# Patient Record
Sex: Female | Born: 1955
Health system: Southern US, Community
[De-identification: ages and names within clinical notes are randomized; demographics above are authoritative.]

## PROBLEM LIST (undated history)

## (undated) DIAGNOSIS — R5383 Other fatigue: Secondary | ICD-10-CM

## (undated) DIAGNOSIS — Z1379 Encounter for other screening for genetic and chromosomal anomalies: Secondary | ICD-10-CM

## (undated) DIAGNOSIS — G5603 Carpal tunnel syndrome, bilateral upper limbs: Secondary | ICD-10-CM

## (undated) DIAGNOSIS — Z803 Family history of malignant neoplasm of breast: Secondary | ICD-10-CM

## (undated) DIAGNOSIS — R232 Flushing: Secondary | ICD-10-CM

## (undated) DIAGNOSIS — R011 Cardiac murmur, unspecified: Secondary | ICD-10-CM

## (undated) DIAGNOSIS — I10 Essential (primary) hypertension: Secondary | ICD-10-CM

## (undated) DIAGNOSIS — Z923 Personal history of irradiation: Secondary | ICD-10-CM

## (undated) HISTORY — PX: BREAST EXCISIONAL BIOPSY: SUR124

## (undated) HISTORY — PX: PARTIAL HYSTERECTOMY: SHX80

## (undated) HISTORY — DX: Carpal tunnel syndrome, bilateral upper limbs: G56.03

## (undated) HISTORY — DX: Family history of malignant neoplasm of breast: Z80.3

## (undated) HISTORY — DX: Flushing: R23.2

## (undated) HISTORY — DX: Other fatigue: R53.83

## (undated) HISTORY — PX: AUGMENTATION MAMMAPLASTY: SUR837

## (undated) HISTORY — PX: KNEE SURGERY: SHX244

## (undated) HISTORY — DX: Essential (primary) hypertension: I10

## (undated) HISTORY — PX: FOOT SURGERY: SHX648

## (undated) HISTORY — DX: Encounter for other screening for genetic and chromosomal anomalies: Z13.79

---

## 2007-01-28 ENCOUNTER — Inpatient Hospital Stay (HOSPITAL_COMMUNITY): Admission: EM | Admit: 2007-01-28 | Discharge: 2007-01-30 | Payer: Self-pay | Admitting: Emergency Medicine

## 2007-03-11 ENCOUNTER — Encounter: Admission: RE | Admit: 2007-03-11 | Discharge: 2007-06-09 | Payer: Self-pay | Admitting: Specialist

## 2008-09-26 ENCOUNTER — Observation Stay: Payer: Self-pay | Admitting: Internal Medicine

## 2010-11-19 ENCOUNTER — Other Ambulatory Visit (HOSPITAL_COMMUNITY): Payer: Self-pay | Admitting: Obstetrics and Gynecology

## 2010-11-19 DIAGNOSIS — N63 Unspecified lump in unspecified breast: Secondary | ICD-10-CM

## 2010-12-10 ENCOUNTER — Ambulatory Visit
Admission: RE | Admit: 2010-12-10 | Discharge: 2010-12-10 | Disposition: A | Discharge status: Home / Self Care | Payer: BC Managed Care – PPO | Source: Ambulatory Visit | Attending: Obstetrics and Gynecology | Admitting: Obstetrics and Gynecology

## 2010-12-10 DIAGNOSIS — N63 Unspecified lump in unspecified breast: Secondary | ICD-10-CM

## 2010-12-24 NOTE — Op Note (Signed)
NAMESIRENIA, WHITIS                ACCOUNT NO.:  [id number]   MEDICAL RECORD NO.:  [id number]          PATIENT TYPE:  INP   LOCATION:  1444                         FACILITY:  [facility]   PHYSICIAN:  Kerrin Champagne, M.D.   DATE OF BIRTH:  [birth year]   DATE OF PROCEDURE:  01/28/2007  DATE OF DISCHARGE:                               OPERATIVE REPORT   PREOPERATIVE DIAGNOSIS:  Comminuted closed left inferior pole patella  fracture.   POSTOPERATIVE DIAGNOSIS:  Comminuted closed left inferior pole patella  fracture.   PROCEDURE:  Excision of left inferior pole patellar fracture fragments  and repair of patellar tendon to superior pole fragment, repair of  retinaculum of the knee.   SURGEON:  Kerrin Champagne, M.D.   ASSISTANT:  None.   ANESTHESIA:  General via orotracheal intubation, Dr. Lucille Passy   TOTAL TOURNIQUET TIME:  280 mmHg for 63 minutes.   ESTIMATED BLOOD LOSS:  150 mL, all knee effusion primarily.   COMPLICATIONS:  None.   BRIEF CLINICAL HISTORY:  A 55 year old female who reportedly fell at the  edge of her yard last evening about 7:30 landing with her left knee  against a concrete water drain.  This happened at the edge of her yard  and roadside.  She is brought to the emergency room and underwent  evaluation with radiographs demonstrating a severely comminuted inferior  pole patella fracture of the left leg.  Fracture in four fragments.  The  superior pole larger fragment was in good condition.   INTRAOPERATIVE FINDINGS:  As described.  The patient was found to have  primarily a comminuted inferior pole patellar fracture with multiple  pieces loose without soft tissue attachment, therefore, requiring  resection of the inferior pole, comminuted fragments, and reattachment  of the patellar tendon to the superior pole fragments using FiberWire  through drill holes.   DESCRIPTION OF PROCEDURE:  After adequate general anesthesia, the left  lower extremity prep  with DuraPrep solution, draped in the usual manner.  Elevation of the left lower extremity, tourniquet inflated to 280 mmHg  with Esmarch used to exsanguinate the leg.  Leg flexed while inflating  the tourniquet.  Note that the triangles for the knee were used during  the case.  The incision of midline approximately 10-11 cm in length  through skin and subcu layers down to the peritenon layer.  This was  incised in line with the skin incision and spread both medial lateral.   Fracture site identified, opened, irrigated with copious amounts of  irrigant solution.  Examination of inferior pole fragments demonstrated  that 2-3 fragments were loose free and no significant attachments.  They  were comminuted and crushed and unable to be fixed adequately.  They  were, therefore, resected leaving some bony material remaining at the  superior margin of the patella tendon.  The knee joint was inspected,  irrigated with copious amounts of irrigant solution in order to  determine if there were any fragments of bone within the joint, none was  present.  Following irrigation, then two #2 FiberWires were placed just  posterior and inferior to the bony fragments with pull of the patella  ligament that was remaining as residual.  These were then passed  superiorly. Drill holes were then placed, a central drill hole within  the central portion of the patella extending from the superior anterior  margin of the patella to near the bone surface of the patient's patella  at the osteochondral junction.  Note, that a 3 mm cutting bur was then  used to cut a trough in the broken distal end of the patella to allow  for adequate bone surface for healing of the tendon to bone.   After placing the drill holes, then the suture passer was used to pass  two central sutures through the central hole and then medial and lateral  as appropriate.  These were then pulled distal and range of motion of  the knee demonstrated  the patella tracking quite nicely within the  distal femoral groove with no signs of subluxation, either medial or  lateral.  Towel clips were then attached to the retinaculum near the  area of the fracture site in order to hold this closed.  Then, the  FiberWire sutures tightened over the superior pole of the patella  holding it in place with a hemostat while suturing tight.  This  completed, then range of motion of the knee was performed.  This  demonstrated excellent range of motion of the patella and excellent  tracking within the central portions of the distal femoral trochlea.  This being the case, irrigation was performed both medial and lateral.  The patient's synovium was then approximated with interrupted 0 Vicryl  sutures.  The retinaculum of the knee approximated with interrupted 0  Vicryl sutures, both medial and lateral.   The patient then had 0 Vicryl sutures used to attach soft tissue  remnants of the patient's patellar tendon over the superior aspect of  the fracture site closing this area down, removing bone where necessary  in order to ensure a smooth appearance to the patella inferiorly.  The  paratenon was then approximated with interrupted 0 Vicryl sutures, deep  subcu layers with interrupted 0 Vicryl sutures, the more superficial  layers with interrupted 2-0 Vicryl sutures, and the skin closed with a  running subcu stitch of 4-0 Vicryl.  Steri-Strips were then applied,  4x4s, ABD pad, fixed to the skin with Kerlix and Ace then Webril applied  from the left ankle to the upper thigh.  Ace wrap applied.  The patient  then had an ice pack and then knee immobilizer.  The tourniquet was  released prior to application of dressings.  The patient was then  reactivated, extubated, and returned to the recovery room in  satisfactory condition.  All instrument and sponge counts were correct.      Kerrin Champagne, M.D.  Electronically Signed    JEN/MEDQ  D:  01/28/2007   T:  01/28/2007  Job:  [id number]

## 2011-05-28 LAB — HEMOGLOBIN AND HEMATOCRIT, BLOOD
HCT: 29.6 — ABNORMAL LOW
Hemoglobin: 10.6 — ABNORMAL LOW

## 2011-05-28 LAB — DIFFERENTIAL
Basophils Relative: 1
Eosinophils Absolute: 0.1
Lymphs Abs: 2.6
Monocytes Relative: 7
Neutro Abs: 8 — ABNORMAL HIGH
Neutrophils Relative %: 69

## 2011-05-28 LAB — CBC
HCT: 31.3 — ABNORMAL LOW
Hemoglobin: 10.6 — ABNORMAL LOW
MCHC: 34
RBC: 3.95
RDW: 13.2

## 2011-05-28 LAB — COMPREHENSIVE METABOLIC PANEL
Alkaline Phosphatase: 88
BUN: 16
CO2: 26
Calcium: 8.8
GFR calc non Af Amer: >60
Glucose, Bld: 152 — ABNORMAL HIGH
Total Protein: 6.9

## 2013-04-04 ENCOUNTER — Other Ambulatory Visit: Payer: Self-pay

## 2013-04-04 ENCOUNTER — Ambulatory Visit
Admission: RE | Admit: 2013-04-04 | Discharge: 2013-04-04 | Disposition: A | Discharge status: Home / Self Care | Payer: Self-pay | Source: Ambulatory Visit

## 2013-04-04 DIAGNOSIS — Z1231 Encounter for screening mammogram for malignant neoplasm of breast: Secondary | ICD-10-CM

## 2013-12-27 ENCOUNTER — Ambulatory Visit: Payer: BC Managed Care – PPO | Attending: Specialist

## 2013-12-27 DIAGNOSIS — R262 Difficulty in walking, not elsewhere classified: Secondary | ICD-10-CM | POA: Insufficient documentation

## 2013-12-27 DIAGNOSIS — IMO0001 Reserved for inherently not codable concepts without codable children: Secondary | ICD-10-CM | POA: Insufficient documentation

## 2013-12-27 DIAGNOSIS — M25569 Pain in unspecified knee: Secondary | ICD-10-CM | POA: Insufficient documentation

## 2014-01-09 ENCOUNTER — Ambulatory Visit: Payer: BC Managed Care – PPO | Attending: Specialist | Admitting: Physical Therapy

## 2014-01-09 DIAGNOSIS — R262 Difficulty in walking, not elsewhere classified: Secondary | ICD-10-CM | POA: Insufficient documentation

## 2014-01-09 DIAGNOSIS — M25569 Pain in unspecified knee: Secondary | ICD-10-CM | POA: Insufficient documentation

## 2014-01-09 DIAGNOSIS — Z5189 Encounter for other specified aftercare: Secondary | ICD-10-CM | POA: Insufficient documentation

## 2014-01-11 ENCOUNTER — Ambulatory Visit: Payer: BC Managed Care – PPO | Admitting: Physical Therapy

## 2014-01-16 ENCOUNTER — Encounter: Payer: BC Managed Care – PPO | Admitting: Physical Therapy

## 2014-01-18 ENCOUNTER — Encounter: Payer: BC Managed Care – PPO | Admitting: Physical Therapy

## 2014-04-05 ENCOUNTER — Other Ambulatory Visit: Payer: Self-pay

## 2014-04-05 DIAGNOSIS — Z1231 Encounter for screening mammogram for malignant neoplasm of breast: Secondary | ICD-10-CM

## 2014-04-12 ENCOUNTER — Ambulatory Visit
Admission: RE | Admit: 2014-04-12 | Discharge: 2014-04-12 | Disposition: A | Discharge status: Home / Self Care | Payer: BC Managed Care – PPO | Source: Ambulatory Visit

## 2014-04-12 DIAGNOSIS — Z1231 Encounter for screening mammogram for malignant neoplasm of breast: Secondary | ICD-10-CM

## 2014-08-08 ENCOUNTER — Encounter: Payer: Self-pay | Admitting: *Deleted

## 2014-09-26 ENCOUNTER — Encounter: Payer: Self-pay | Admitting: *Deleted

## 2015-01-30 ENCOUNTER — Other Ambulatory Visit: Payer: Self-pay | Admitting: Specialist

## 2015-01-30 DIAGNOSIS — M25562 Pain in left knee: Secondary | ICD-10-CM

## 2015-02-03 ENCOUNTER — Ambulatory Visit
Admission: RE | Admit: 2015-02-03 | Discharge: 2015-02-03 | Disposition: A | Discharge status: Home / Self Care | Payer: BLUE CROSS/BLUE SHIELD | Source: Ambulatory Visit | Attending: Specialist | Admitting: Specialist

## 2015-02-03 DIAGNOSIS — M25562 Pain in left knee: Secondary | ICD-10-CM

## 2015-02-05 ENCOUNTER — Other Ambulatory Visit: Payer: Self-pay | Admitting: Specialist

## 2015-02-05 DIAGNOSIS — M25562 Pain in left knee: Secondary | ICD-10-CM

## 2015-03-16 ENCOUNTER — Other Ambulatory Visit: Payer: Self-pay

## 2015-03-16 DIAGNOSIS — Z1231 Encounter for screening mammogram for malignant neoplasm of breast: Secondary | ICD-10-CM

## 2015-04-18 ENCOUNTER — Ambulatory Visit: Payer: BLUE CROSS/BLUE SHIELD

## 2016-01-17 DIAGNOSIS — M25562 Pain in left knee: Secondary | ICD-10-CM | POA: Diagnosis not present

## 2016-02-08 DIAGNOSIS — J018 Other acute sinusitis: Secondary | ICD-10-CM | POA: Diagnosis not present

## 2016-03-12 DIAGNOSIS — L700 Acne vulgaris: Secondary | ICD-10-CM | POA: Diagnosis not present

## 2016-03-12 DIAGNOSIS — L819 Disorder of pigmentation, unspecified: Secondary | ICD-10-CM | POA: Diagnosis not present

## 2016-03-27 DIAGNOSIS — Q686 Discoid meniscus: Secondary | ICD-10-CM | POA: Diagnosis not present

## 2016-04-03 DIAGNOSIS — M25562 Pain in left knee: Secondary | ICD-10-CM | POA: Diagnosis not present

## 2016-04-09 DIAGNOSIS — I10 Essential (primary) hypertension: Secondary | ICD-10-CM | POA: Diagnosis not present

## 2016-04-23 DIAGNOSIS — Z01419 Encounter for gynecological examination (general) (routine) without abnormal findings: Secondary | ICD-10-CM | POA: Diagnosis not present

## 2016-04-23 DIAGNOSIS — N951 Menopausal and female climacteric states: Secondary | ICD-10-CM | POA: Diagnosis not present

## 2016-04-23 DIAGNOSIS — Z8489 Family history of other specified conditions: Secondary | ICD-10-CM | POA: Diagnosis not present

## 2016-04-23 DIAGNOSIS — Z1231 Encounter for screening mammogram for malignant neoplasm of breast: Secondary | ICD-10-CM | POA: Diagnosis not present

## 2016-05-05 DIAGNOSIS — G8918 Other acute postprocedural pain: Secondary | ICD-10-CM | POA: Diagnosis not present

## 2016-05-05 DIAGNOSIS — M7652 Patellar tendinitis, left knee: Secondary | ICD-10-CM | POA: Diagnosis not present

## 2016-05-05 DIAGNOSIS — M94262 Chondromalacia, left knee: Secondary | ICD-10-CM | POA: Diagnosis not present

## 2016-05-12 ENCOUNTER — Inpatient Hospital Stay (INDEPENDENT_AMBULATORY_CARE_PROVIDER_SITE_OTHER): Payer: BLUE CROSS/BLUE SHIELD | Admitting: Orthopedic Surgery

## 2016-05-12 DIAGNOSIS — M94262 Chondromalacia, left knee: Secondary | ICD-10-CM

## 2016-05-12 DIAGNOSIS — M7652 Patellar tendinitis, left knee: Secondary | ICD-10-CM

## 2016-06-02 ENCOUNTER — Ambulatory Visit (INDEPENDENT_AMBULATORY_CARE_PROVIDER_SITE_OTHER): Payer: BLUE CROSS/BLUE SHIELD | Admitting: Orthopedic Surgery

## 2016-06-02 DIAGNOSIS — M94262 Chondromalacia, left knee: Secondary | ICD-10-CM | POA: Insufficient documentation

## 2016-06-02 NOTE — Progress Notes (Signed)
   Office Visit Note  Patient is postop left knee DOA and debridement done on 05/05/16, 4 weeks out. Doing well, no pain/problems, is able to go up and down steps with ease now. No meds being taken for left knee. She does report a little pain at nighttime, nothing requiring meds.  Patient is now 4 weeks out from arthroscopy she is having a little bit of pain at night she generally okay on the stairs she's able to go to the grocery store she is not taking any pain medications she works at [employer] I think she is okay to do sedentary work for 2 weeks and then return to regular duty at [employer] I am going to  see her back as needed. on exam she has excellent range of motion and no effusion in the left knee she has a little bit of quad atrophy but that will be improved with continued nonweightbearing quad strengthening exercises  Patient: Brandy Thornton           Date of Birth: May 06, 1956           MRN: [id number] Visit Date: 06/02/2016              Requested by: Basil Dess, MD PCP: [facility], Carol Ada, MD   Assessment & Plan: Visit Diagnoses:  1. Chondromalacia of left knee     Plan:   Follow-Up Instructions: Return if symptoms worsen or fail to improve.   Orders:  No orders of the defined types were placed in this encounter.  No orders of the defined types were placed in this encounter.     Procedures: No procedures performed   Clinical Data: No additional findings.   Subjective: Chief Complaint  Patient presents with  . Left Knee - Routine Post Op    HPI  Review of Systems   Objective: Vital Signs: Ht 5\' 6"  (1.676 m)   Wt 175 lb (79.4 kg)   BMI 28.25 kg/m   Physical Exam  Ortho Exam  Specialty Comments:  No specialty comments available.  Imaging: No results found.   PMFS History: Patient Active Problem List   Diagnosis Date Noted  . Chondromalacia of left knee 06/02/2016   Past Medical History:  Diagnosis Date  . Benign  hypertension   . Bilateral carpal tunnel syndrome   . Fatigue   . Hot flashes     No family history on file.  No past surgical history on file. Social History   Occupational History  . Not on file.   Social History Main Topics  . Smoking status: Not on file  . Smokeless tobacco: Not on file  . Alcohol use Not on file  . Drug use: Unknown  . Sexual activity: Not on file

## 2016-10-13 DIAGNOSIS — I10 Essential (primary) hypertension: Secondary | ICD-10-CM | POA: Diagnosis not present

## 2016-11-13 DIAGNOSIS — R944 Abnormal results of kidney function studies: Secondary | ICD-10-CM | POA: Diagnosis not present

## 2017-05-06 DIAGNOSIS — N951 Menopausal and female climacteric states: Secondary | ICD-10-CM | POA: Diagnosis not present

## 2017-05-06 DIAGNOSIS — Z01419 Encounter for gynecological examination (general) (routine) without abnormal findings: Secondary | ICD-10-CM | POA: Diagnosis not present

## 2017-07-08 DIAGNOSIS — L700 Acne vulgaris: Secondary | ICD-10-CM | POA: Diagnosis not present

## 2017-07-08 DIAGNOSIS — L819 Disorder of pigmentation, unspecified: Secondary | ICD-10-CM | POA: Diagnosis not present

## 2017-10-07 DIAGNOSIS — N898 Other specified noninflammatory disorders of vagina: Secondary | ICD-10-CM | POA: Diagnosis not present

## 2017-10-07 DIAGNOSIS — N941 Unspecified dyspareunia: Secondary | ICD-10-CM | POA: Diagnosis not present

## 2017-10-07 DIAGNOSIS — N644 Mastodynia: Secondary | ICD-10-CM | POA: Diagnosis not present

## 2017-10-07 DIAGNOSIS — N6310 Unspecified lump in the right breast, unspecified quadrant: Secondary | ICD-10-CM | POA: Diagnosis not present

## 2017-10-08 ENCOUNTER — Other Ambulatory Visit: Payer: Self-pay | Admitting: Obstetrics and Gynecology

## 2017-10-08 DIAGNOSIS — N6310 Unspecified lump in the right breast, unspecified quadrant: Secondary | ICD-10-CM

## 2017-10-14 ENCOUNTER — Other Ambulatory Visit: Payer: Self-pay | Admitting: Obstetrics and Gynecology

## 2017-10-14 ENCOUNTER — Ambulatory Visit
Admission: RE | Admit: 2017-10-14 | Discharge: 2017-10-14 | Disposition: A | Discharge status: Home / Self Care | Payer: BLUE CROSS/BLUE SHIELD | Source: Ambulatory Visit | Attending: Obstetrics and Gynecology | Admitting: Obstetrics and Gynecology

## 2017-10-14 DIAGNOSIS — N6310 Unspecified lump in the right breast, unspecified quadrant: Secondary | ICD-10-CM

## 2017-10-14 DIAGNOSIS — R922 Inconclusive mammogram: Secondary | ICD-10-CM | POA: Diagnosis not present

## 2017-10-14 DIAGNOSIS — R599 Enlarged lymph nodes, unspecified: Secondary | ICD-10-CM

## 2017-10-20 ENCOUNTER — Ambulatory Visit
Admission: RE | Admit: 2017-10-20 | Discharge: 2017-10-20 | Disposition: A | Discharge status: Home / Self Care | Payer: BLUE CROSS/BLUE SHIELD | Source: Ambulatory Visit | Attending: Obstetrics and Gynecology | Admitting: Obstetrics and Gynecology

## 2017-10-20 ENCOUNTER — Other Ambulatory Visit: Payer: Self-pay | Admitting: Obstetrics and Gynecology

## 2017-10-20 DIAGNOSIS — N6310 Unspecified lump in the right breast, unspecified quadrant: Secondary | ICD-10-CM

## 2017-10-20 DIAGNOSIS — R599 Enlarged lymph nodes, unspecified: Secondary | ICD-10-CM

## 2017-10-20 DIAGNOSIS — C773 Secondary and unspecified malignant neoplasm of axilla and upper limb lymph nodes: Secondary | ICD-10-CM | POA: Diagnosis not present

## 2017-10-20 DIAGNOSIS — R590 Localized enlarged lymph nodes: Secondary | ICD-10-CM | POA: Diagnosis not present

## 2017-10-20 DIAGNOSIS — C50311 Malignant neoplasm of lower-inner quadrant of right female breast: Secondary | ICD-10-CM | POA: Diagnosis not present

## 2017-10-20 DIAGNOSIS — N6314 Unspecified lump in the right breast, lower inner quadrant: Secondary | ICD-10-CM | POA: Diagnosis not present

## 2017-10-21 ENCOUNTER — Telehealth: Payer: Self-pay | Admitting: Hematology and Oncology

## 2017-10-21 NOTE — Telephone Encounter (Signed)
Spoke with patient to confirm morning [facility] appointment for 3/20, packet will be emailed to patient

## 2017-10-22 ENCOUNTER — Encounter: Payer: Self-pay | Admitting: *Deleted

## 2017-10-22 DIAGNOSIS — C50311 Malignant neoplasm of lower-inner quadrant of right female breast: Secondary | ICD-10-CM | POA: Insufficient documentation

## 2017-10-22 DIAGNOSIS — Z170 Estrogen receptor positive status [ER+]: Secondary | ICD-10-CM

## 2017-10-25 DIAGNOSIS — C50919 Malignant neoplasm of unspecified site of unspecified female breast: Secondary | ICD-10-CM | POA: Insufficient documentation

## 2017-10-27 ENCOUNTER — Other Ambulatory Visit: Payer: Self-pay | Admitting: *Deleted

## 2017-10-28 ENCOUNTER — Inpatient Hospital Stay: Payer: BLUE CROSS/BLUE SHIELD

## 2017-10-28 ENCOUNTER — Telehealth: Payer: Self-pay | Admitting: *Deleted

## 2017-10-28 ENCOUNTER — Encounter: Payer: Self-pay | Admitting: *Deleted

## 2017-10-28 ENCOUNTER — Other Ambulatory Visit: Payer: Self-pay

## 2017-10-28 ENCOUNTER — Ambulatory Visit
Admission: RE | Admit: 2017-10-28 | Discharge: 2017-10-28 | Disposition: A | Discharge status: Home / Self Care | Payer: BLUE CROSS/BLUE SHIELD | Source: Ambulatory Visit | Attending: Radiation Oncology | Admitting: Radiation Oncology

## 2017-10-28 ENCOUNTER — Ambulatory Visit: Payer: BLUE CROSS/BLUE SHIELD | Attending: Surgery | Admitting: Physical Therapy

## 2017-10-28 ENCOUNTER — Encounter: Payer: Self-pay | Admitting: Hematology and Oncology

## 2017-10-28 ENCOUNTER — Other Ambulatory Visit: Payer: Self-pay | Admitting: *Deleted

## 2017-10-28 ENCOUNTER — Encounter: Payer: Self-pay | Admitting: Physical Therapy

## 2017-10-28 ENCOUNTER — Inpatient Hospital Stay: Payer: BLUE CROSS/BLUE SHIELD | Attending: Hematology and Oncology | Admitting: Hematology and Oncology

## 2017-10-28 DIAGNOSIS — Z803 Family history of malignant neoplasm of breast: Secondary | ICD-10-CM

## 2017-10-28 DIAGNOSIS — C50311 Malignant neoplasm of lower-inner quadrant of right female breast: Secondary | ICD-10-CM

## 2017-10-28 DIAGNOSIS — Z170 Estrogen receptor positive status [ER+]: Secondary | ICD-10-CM

## 2017-10-28 DIAGNOSIS — C773 Secondary and unspecified malignant neoplasm of axilla and upper limb lymph nodes: Secondary | ICD-10-CM

## 2017-10-28 DIAGNOSIS — R293 Abnormal posture: Secondary | ICD-10-CM | POA: Insufficient documentation

## 2017-10-28 DIAGNOSIS — C50911 Malignant neoplasm of unspecified site of right female breast: Secondary | ICD-10-CM | POA: Diagnosis not present

## 2017-10-28 LAB — CBC WITH DIFFERENTIAL (CANCER CENTER ONLY)
BASOS ABS: 0.1 10*3/uL (ref 0.0–0.1)
Basophils Relative: 1 %
Eosinophils Absolute: 0.1 10*3/uL (ref 0.0–0.5)
Eosinophils Relative: 1 %
HEMATOCRIT: 36.4 % (ref 34.8–46.6)
Hemoglobin: 12.2 g/dL (ref 11.6–15.9)
LYMPHS PCT: 34 %
Lymphs Abs: 2.2 10*3/uL (ref 0.9–3.3)
MCH: 26.8 pg (ref 25.1–34.0)
MCHC: 33.6 g/dL (ref 31.5–36.0)
MCV: 79.9 fL (ref 79.5–101.0)
MONO ABS: 0.6 10*3/uL (ref 0.1–0.9)
Monocytes Relative: 9 %
NEUTROS ABS: 3.5 10*3/uL (ref 1.5–6.5)
Neutrophils Relative %: 55 %
Platelet Count: 448 10*3/uL — ABNORMAL HIGH (ref 145–400)
RBC: 4.56 MIL/uL (ref 3.70–5.45)
RDW: 14.2 % (ref 11.2–14.5)
WBC: 6.5 10*3/uL (ref 3.9–10.3)

## 2017-10-28 LAB — CMP (CANCER CENTER ONLY)
ALBUMIN: 4.2 g/dL (ref 3.5–5.0)
ALT: 14 U/L (ref 0–55)
ANION GAP: 10 (ref 3–11)
AST: 16 U/L (ref 5–34)
Alkaline Phosphatase: 60 U/L (ref 40–150)
BILIRUBIN TOTAL: 0.4 mg/dL (ref 0.2–1.2)
BUN: 11 mg/dL (ref 7–26)
CO2: 27 mmol/L (ref 22–29)
Calcium: 10.4 mg/dL (ref 8.4–10.4)
Chloride: 104 mmol/L (ref 98–109)
Creatinine: 0.74 mg/dL (ref 0.60–1.10)
GFR, Est AFR Am: >60 mL/min (ref >60)
GFR, Estimated: >60 mL/min (ref >60)
GLUCOSE: 102 mg/dL (ref 70–140)
POTASSIUM: 4.2 mmol/L (ref 3.5–5.1)
Sodium: 141 mmol/L (ref 136–145)
TOTAL PROTEIN: 8.1 g/dL (ref 6.4–8.3)

## 2017-10-28 MED ORDER — LETROZOLE 2.5 MG PO TABS: 2.5000 mg | ORAL_TABLET | Freq: Every day | ORAL | 3 refills | Status: DC

## 2017-10-28 NOTE — Telephone Encounter (Signed)
Ordered mammaprint (core) per Dr. Lindi Adie.  Faxed requisition to [place] and [place].

## 2017-10-28 NOTE — Progress Notes (Addendum)
Radiation Oncology         [phone] ________________________________  Name: Brandy Thornton        MRN: [id number]  Date of Service: 10/28/2017 DOB: [birth year]  CC:[facility], [facility]  [name] Overall, MD     REFERRING PHYSICIAN: [name] Overall, MD   DIAGNOSIS: The encounter diagnosis was Malignant neoplasm of lower-inner quadrant of right breast of female, estrogen receptor positive ([place]).  Stage IIA, cT2 N1 M0, grade 2, ER/PR positive Invasive lobular carcinoma, mammary carcinoma in situ of the right breast.  HISTORY OF PRESENT ILLNESS: Brandy Thornton is a 62 y.o. female seen in the multidisciplinary breast clinic for a new diagnosis of right breast cancer. The patient self-palpated a lump within the right breast. Subsequent mammogram on 10/14/17 showed a highly suspicious mass in the right breast at the 4:30 position, 4 cm from the nipple. This mass measured 2.8 cm with associated architectural distortion. There was an additional irregular mass within the right breast at the 3:30 position, 1 cm from the nipple, measuring 1.1 cm. This was suspicious for multifocal disease. At least 3 mildly prominent lymph nodes noted in the right axilla and lower axilla. Biopsy of the masses on 10/20/17 showed grade II invasive mammary carcinoma, mammary carcinoma in situ of the right breast both at the 4:30 and 3:30 positions. Metastatic mammary carcinoma noted in the right axillary lymph node. She comes today for treatment recommendations regarding her cancer.  PREVIOUS RADIATION THERAPY: No   PAST MEDICAL HISTORY:  Past Medical History:  Diagnosis Date  . Benign hypertension   . Bilateral carpal tunnel syndrome   . Fatigue   . Hot flashes        PAST SURGICAL HISTORY: Past Surgical History:  Procedure Laterality Date  . BREAST EXCISIONAL BIOPSY Bilateral      FAMILY HISTORY:  Family History  Problem Relation Age of Onset  . Breast cancer Sister 26     SOCIAL  HISTORY:   She is single and lives in [place]. She works for [employer] in an [employer] in [place]. She is accompanied by her sister, son, and parents.   ALLERGIES: Lisinopril   MEDICATIONS:  Current Outpatient Medications  Medication Sig Dispense Refill  . amLODipine (NORVASC) 5 MG tablet Take 5 mg by mouth daily.    . valsartan-hydrochlorothiazide (DIOVAN-HCT) 320-12.5 MG per tablet Take 1 tablet by mouth daily.     No current facility-administered medications for this encounter.      REVIEW OF SYSTEMS: On review of systems, the patient reports that she is doing well overall. Patient is positive for contacts, breast lump and pain, and hot flashes. She denies any chest pain, shortness of breath, cough, fevers, chills, night sweats, unintended weight changes. She denies any bowel or bladder disturbances, and denies abdominal pain, nausea or vomiting. She denies any new musculoskeletal or joint aches or pains. A complete review of systems is obtained and is otherwise negative.     PHYSICAL EXAM:  Wt Readings from Last 3 Encounters:  06/02/16 175 lb (79.4 kg)   Temp Readings from Last 3 Encounters:  No data found for Temp   BP Readings from Last 3 Encounters:  No data found for BP   Pulse Readings from Last 3 Encounters:  No data found for Pulse   Vitals with BMI 10/28/2017  Height 5' 6"   Weight 170 lbs 11 oz  BMI 17.49  Systolic 449  Diastolic 99  Pulse 88  Respirations 18  In general this is a well appearing African American female in no acute distress. She is alert and oriented x4 and appropriate throughout the examination. HEENT reveals that the patient is normocephalic, atraumatic. EOMs are intact. PERRLA. Skin is intact without any evidence of gross lesions. Cardiovascular exam reveals a regular rate and rhythm, no clicks rubs or murmurs are auscultated. Chest is clear to auscultation bilaterally. Lymphatic assessment is performed and does not reveal any adenopathy  in the cervical, supraclavicular, axillary, or inguinal chains. Bilateral breast exam is performed. Left breast had no palpable mass or nipple discharge. Right breast the patient has palpable induration in the lower inner quadrant with some mild bruising noted. This area of induration measured 2 x 3 cm. There was no nipple discharge or bleeding. Patient has a small biopsy site from her axillary node biopsy. Abdomen has active bowel sounds in all quadrants and is intact. The abdomen is soft, non tender, non distended. Lower extremities are negative for pretibial pitting edema, deep calf tenderness, cyanosis or clubbing.   ECOG = 0  0 - Asymptomatic (Fully active, able to carry on all predisease activities without restriction)  1 - Symptomatic but completely ambulatory (Restricted in physically strenuous activity but ambulatory and able to carry out work of a light or sedentary nature. For example, light housework, office work)  2 - Symptomatic, <50% in bed during the day (Ambulatory and capable of all self care but unable to carry out any work activities. Up and about more than 50% of waking hours)  3 - Symptomatic, >50% in bed, but not bedbound (Capable of only limited self-care, confined to bed or chair 50% or more of waking hours)  4 - Bedbound (Completely disabled. Cannot carry on any self-care. Totally confined to bed or chair)  5 - Death   Eustace Pen MM, Creech RH, Tormey DC, et al. [phone]). "Toxicity and response criteria of the [facility]". [place] Oncol. 5 (6): 649-55    LABORATORY DATA:  Lab Results  Component Value Date   WBC 11.6 (H) 01/27/2007   HGB 10.6 (L) 01/29/2007   HCT 29.6 (L) 01/29/2007   MCV 79.2 01/27/2007   PLT 435 (H) 01/27/2007   Lab Results  Component Value Date   NA 139 01/27/2007   K 3.5 01/27/2007   CL 108 01/27/2007   CO2 26 01/27/2007   Lab Results  Component Value Date   ALT 24 01/27/2007   AST 21 01/27/2007   ALKPHOS 88  01/27/2007   BILITOT 0.4 01/27/2007      RADIOGRAPHY: US Breast Ltd Uni Right Inc Axilla  Result Date: 10/14/2017 CLINICAL DATA:  Patient describes a palpable lump within the right breast. Family history of breast cancer (sister at age 26). EXAM: DIGITAL DIAGNOSTIC BILATERAL MAMMOGRAM WITH CAD AND TOMO ULTRASOUND RIGHT BREAST COMPARISON:  Previous exam(s). ACR Breast Density Category c: The breast tissue is heterogeneously dense, which may obscure small masses. FINDINGS: There is architectural distortion within the lower inner quadrant of the right breast, middle to posterior depth, with probable associated mass, corresponding to the area of palpable concern. There are no additional dominant masses, suspicious calcifications or secondary signs of malignancy identified within the right breast. There are no dominant masses, suspicious calcifications or secondary signs of malignancy identified within the left breast. Mammographic images were processed with CAD. Targeted ultrasound is performed, showing an irregular hypoechoic mass in the right breast at the 4:30 o'clock axis, 4 cm from the nipple, measuring  2.8 x 1.9 x 2 cm, with internal vascularity, corresponding to the palpable lump and associated mammographic finding. There is an additional irregular hypoechoic mass in the right breast at the 3:30 o'clock axis, 1 cm from the nipple, measuring 1.1 x 0.9 x 1 cm, anti parallel orientation, avascular, corresponding as an incidental finding. Right axilla was evaluated with ultrasound showing a mildly prominent lymph node in the lower right axilla with cortical thickness measurement of 4 mm, suspicious for metastasis. Two additional mildly prominent lymph nodes, with asymmetric cortical thickening, are identified in the right axilla. IMPRESSION: 1. Highly suspicious mass in the right breast at the 4:30 o'clock axis, 4 cm from the nipple, measuring 2.8 cm, with associated architectural distortion, corresponding to  patient's palpable lump. Ultrasound-guided biopsy is recommended. 2. Additional irregular mass within the right breast at the 3:30 o'clock axis, 1 cm from the nipple, measuring 1.1 cm, suspicious for multicentric disease. Ultrasound-guided biopsy is also recommended for this mass. 3. At least 3 mildly prominent lymph nodes in the right axilla and lower axilla. Ultrasound-guided biopsy is recommended for 1 of these lymph nodes. 4. No evidence of malignancy within the left breast. RECOMMENDATION: 1. Ultrasound-guided biopsy of the dominant right breast mass at the 4:30 o'clock axis. 2. Ultrasound-guided biopsy of the additional smaller mass within the right breast at the 3:30 o'clock axis. 3. Ultrasound-guided biopsy of 1 of the mildly prominent lymph nodes in the right axilla or lower axilla. Ultrasound-guided biopsies are scheduled for March 12th. I have discussed the findings and recommendations with the patient. Results were also provided in writing at the conclusion of the visit. If applicable, a reminder letter will be sent to the patient regarding the next appointment. BI-RADS CATEGORY  5: Highly suggestive of malignancy. Electronically Signed   By: Franki Cabot M.D.   On: 10/14/2017 13:33   Mm Diag Breast Tomo Bilateral  Result Date: 10/14/2017 CLINICAL DATA:  Patient describes a palpable lump within the right breast. Family history of breast cancer (sister at age 70). EXAM: DIGITAL DIAGNOSTIC BILATERAL MAMMOGRAM WITH CAD AND TOMO ULTRASOUND RIGHT BREAST COMPARISON:  Previous exam(s). ACR Breast Density Category c: The breast tissue is heterogeneously dense, which may obscure small masses. FINDINGS: There is architectural distortion within the lower inner quadrant of the right breast, middle to posterior depth, with probable associated mass, corresponding to the area of palpable concern. There are no additional dominant masses, suspicious calcifications or secondary signs of malignancy identified within  the right breast. There are no dominant masses, suspicious calcifications or secondary signs of malignancy identified within the left breast. Mammographic images were processed with CAD. Targeted ultrasound is performed, showing an irregular hypoechoic mass in the right breast at the 4:30 o'clock axis, 4 cm from the nipple, measuring 2.8 x 1.9 x 2 cm, with internal vascularity, corresponding to the palpable lump and associated mammographic finding. There is an additional irregular hypoechoic mass in the right breast at the 3:30 o'clock axis, 1 cm from the nipple, measuring 1.1 x 0.9 x 1 cm, anti parallel orientation, avascular, corresponding as an incidental finding. Right axilla was evaluated with ultrasound showing a mildly prominent lymph node in the lower right axilla with cortical thickness measurement of 4 mm, suspicious for metastasis. Two additional mildly prominent lymph nodes, with asymmetric cortical thickening, are identified in the right axilla. IMPRESSION: 1. Highly suspicious mass in the right breast at the 4:30 o'clock axis, 4 cm from the nipple, measuring 2.8 cm, with associated architectural distortion,  corresponding to patient's palpable lump. Ultrasound-guided biopsy is recommended. 2. Additional irregular mass within the right breast at the 3:30 o'clock axis, 1 cm from the nipple, measuring 1.1 cm, suspicious for multicentric disease. Ultrasound-guided biopsy is also recommended for this mass. 3. At least 3 mildly prominent lymph nodes in the right axilla and lower axilla. Ultrasound-guided biopsy is recommended for 1 of these lymph nodes. 4. No evidence of malignancy within the left breast. RECOMMENDATION: 1. Ultrasound-guided biopsy of the dominant right breast mass at the 4:30 o'clock axis. 2. Ultrasound-guided biopsy of the additional smaller mass within the right breast at the 3:30 o'clock axis. 3. Ultrasound-guided biopsy of 1 of the mildly prominent lymph nodes in the right axilla or  lower axilla. Ultrasound-guided biopsies are scheduled for March 12th. I have discussed the findings and recommendations with the patient. Results were also provided in writing at the conclusion of the visit. If applicable, a reminder letter will be sent to the patient regarding the next appointment. BI-RADS CATEGORY  5: Highly suggestive of malignancy. Electronically Signed   By: Franki Cabot M.D.   On: 10/14/2017 13:33   [place] Axillary Node Core Biopsy Right  Addendum Date: 10/21/2017   ADDENDUM REPORT: 10/21/2017 12:50 ADDENDUM: Pathology revealed GRADE II INVASIVE MAMMARY CARCINOMA, MAMMARY CARCINOMA IN SITU of the Right breast, both locations, 4:30 o'clock and 3:30 o'clock. METASTATIC MAMMARY CARCINOMA of the Right axillary lymph node. This was found to be concordant by Dr. Ammie Ferrier. Pathology results were discussed with the patient by telephone. The patient reported doing well after the biopsies with tenderness at the sites. Post biopsy instructions and care were reviewed and questions were answered. The patient was encouraged to call [facility] for any additional concerns. The patient was referred to [facility] on October 28, 2017. Recommendation for bilateral breast MRI for further evaluation of extent of disease, density and family history. Pathology results reported by Terie Purser, RN on 10/21/2017. Electronically Signed   By: Ammie Ferrier M.D.   On: 10/21/2017 12:50   Result Date: 10/21/2017 CLINICAL DATA:  62 year old female presenting for ultrasound-guided biopsy of the right breast. EXAM: ULTRASOUND GUIDED RIGHT BREAST CORE NEEDLE BIOPSY COMPARISON:  Previous exam(s). FINDINGS: I met with the patient and we discussed the procedure of ultrasound-guided biopsy, including benefits and alternatives. We discussed the high likelihood of a successful procedure. We discussed the risks of the  procedure, including infection, bleeding, tissue injury, clip migration, and inadequate sampling. Informed written consent was given. The usual time-out protocol was performed immediately prior to the procedure. Lesion #1 quadrant: Lower-inner quadrant Using sterile technique and 1% Lidocaine as local anesthetic, under direct ultrasound visualization, a 14 gauge spring-loaded device was used to perform biopsy of a mass in the right breast 4:30 using a medial approach. At the conclusion of the procedure a ribbon shaped tissue marker clip was deployed into the biopsy cavity. Lesion #2 quadrant: Lower-inner quadrant Using sterile technique and 1% Lidocaine as local anesthetic, under direct ultrasound visualization, a 14 gauge spring-loaded device was used to perform biopsy of a mass in the right breast at 3:30 using an inferior approach. At the conclusion of the procedure a coil shaped tissue marker clip was deployed into the biopsy cavity. Lesion #3 quadrant: Right axilla Using sterile technique and 1% Lidocaine as local anesthetic, under direct ultrasound visualization, a 14 gauge spring-loaded device was used to perform biopsy of a  right axillary lymph node using an inferior approach. At the conclusion of the procedure a spiral shaped tissue marker clip was deployed into the biopsy cavity. Follow up 2 view mammogram was performed and dictated separately. IMPRESSION: 1.  Ultrasound guided biopsy of a right breast mass at 4:30. 2.  Ultrasound-guided biopsy of a right breast mass at 3:30. 3.  Ultrasound-guided biopsy of a right axillary lymph node. No apparent complications. Electronically Signed: By: Ammie Ferrier M.D. On: 10/20/2017 14:13   Mm Clip Placement Right  Result Date: 10/20/2017 CLINICAL DATA:  Post biopsy mammogram of the right breast status post ultrasound-guided biopsy of 2 right breast masses and a right axillary lymph node. EXAM: DIAGNOSTIC RIGHT MAMMOGRAM POST ULTRASOUND BIOPSY COMPARISON:   Previous exam(s). FINDINGS: Mammographic images were obtained following ultrasound guided biopsy of 2 right breast masses in a right axillary lymph node. The ribbon shaped biopsy marking clip is well positioned at the site of the biopsy at 4:30. The coil shaped biopsy marking clip is appropriately positioned at the 3:30 position. The spiral shaped biopsy marking clip is seen on the MLO projection in the low right axilla. IMPRESSION: Appropriate positioning of the 3 biopsy marking clips in the right breast: Ribbon clip at 4:30, coil clip at 3:30 and spiral clip in the right axilla. Final Assessment: Post Procedure Mammograms for Marker Placement Electronically Signed   By: Ammie Ferrier M.D.   On: 10/20/2017 14:21   [place] Rt Breast Bx W Loc Dev 1st Lesion Img Bx Spec US Guide  Addendum Date: 10/21/2017   ADDENDUM REPORT: 10/21/2017 12:50 ADDENDUM: Pathology revealed GRADE II INVASIVE MAMMARY CARCINOMA, MAMMARY CARCINOMA IN SITU of the Right breast, both locations, 4:30 o'clock and 3:30 o'clock. METASTATIC MAMMARY CARCINOMA of the Right axillary lymph node. This was found to be concordant by Dr. Ammie Ferrier. Pathology results were discussed with the patient by telephone. The patient reported doing well after the biopsies with tenderness at the sites. Post biopsy instructions and care were reviewed and questions were answered. The patient was encouraged to call [facility] for any additional concerns. The patient was referred to [facility] on October 28, 2017. Recommendation for bilateral breast MRI for further evaluation of extent of disease, density and family history. Pathology results reported by Terie Purser, RN on 10/21/2017. Electronically Signed   By: Ammie Ferrier M.D.   On: 10/21/2017 12:50   Result Date: 10/21/2017 CLINICAL DATA:  62 year old female presenting for ultrasound-guided biopsy of  the right breast. EXAM: ULTRASOUND GUIDED RIGHT BREAST CORE NEEDLE BIOPSY COMPARISON:  Previous exam(s). FINDINGS: I met with the patient and we discussed the procedure of ultrasound-guided biopsy, including benefits and alternatives. We discussed the high likelihood of a successful procedure. We discussed the risks of the procedure, including infection, bleeding, tissue injury, clip migration, and inadequate sampling. Informed written consent was given. The usual time-out protocol was performed immediately prior to the procedure. Lesion #1 quadrant: Lower-inner quadrant Using sterile technique and 1% Lidocaine as local anesthetic, under direct ultrasound visualization, a 14 gauge spring-loaded device was used to perform biopsy of a mass in the right breast 4:30 using a medial approach. At the conclusion of the procedure a ribbon shaped tissue marker clip was deployed into the biopsy cavity. Lesion #2 quadrant: Lower-inner quadrant Using sterile technique and 1% Lidocaine as local anesthetic, under direct ultrasound visualization, a 14 gauge spring-loaded device was used to perform  biopsy of a mass in the right breast at 3:30 using an inferior approach. At the conclusion of the procedure a coil shaped tissue marker clip was deployed into the biopsy cavity. Lesion #3 quadrant: Right axilla Using sterile technique and 1% Lidocaine as local anesthetic, under direct ultrasound visualization, a 14 gauge spring-loaded device was used to perform biopsy of a right axillary lymph node using an inferior approach. At the conclusion of the procedure a spiral shaped tissue marker clip was deployed into the biopsy cavity. Follow up 2 view mammogram was performed and dictated separately. IMPRESSION: 1.  Ultrasound guided biopsy of a right breast mass at 4:30. 2.  Ultrasound-guided biopsy of a right breast mass at 3:30. 3.  Ultrasound-guided biopsy of a right axillary lymph node. No apparent complications. Electronically Signed:  By: Ammie Ferrier M.D. On: 10/20/2017 14:13   [place] Rt Breast Bx W Loc Dev Ea Add Lesion Img Bx Spec US Guide  Addendum Date: 10/21/2017   ADDENDUM REPORT: 10/21/2017 12:50 ADDENDUM: Pathology revealed GRADE II INVASIVE MAMMARY CARCINOMA, MAMMARY CARCINOMA IN SITU of the Right breast, both locations, 4:30 o'clock and 3:30 o'clock. METASTATIC MAMMARY CARCINOMA of the Right axillary lymph node. This was found to be concordant by Dr. Ammie Ferrier. Pathology results were discussed with the patient by telephone. The patient reported doing well after the biopsies with tenderness at the sites. Post biopsy instructions and care were reviewed and questions were answered. The patient was encouraged to call [facility] for any additional concerns. The patient was referred to [facility] at [facility] on October 28, 2017. Recommendation for bilateral breast MRI for further evaluation of extent of disease, density and family history. Pathology results reported by Terie Purser, RN on 10/21/2017. Electronically Signed   By: Ammie Ferrier M.D.   On: 10/21/2017 12:50   Result Date: 10/21/2017 CLINICAL DATA:  62 year old female presenting for ultrasound-guided biopsy of the right breast. EXAM: ULTRASOUND GUIDED RIGHT BREAST CORE NEEDLE BIOPSY COMPARISON:  Previous exam(s). FINDINGS: I met with the patient and we discussed the procedure of ultrasound-guided biopsy, including benefits and alternatives. We discussed the high likelihood of a successful procedure. We discussed the risks of the procedure, including infection, bleeding, tissue injury, clip migration, and inadequate sampling. Informed written consent was given. The usual time-out protocol was performed immediately prior to the procedure. Lesion #1 quadrant: Lower-inner quadrant Using sterile technique and 1% Lidocaine as local anesthetic, under direct ultrasound  visualization, a 14 gauge spring-loaded device was used to perform biopsy of a mass in the right breast 4:30 using a medial approach. At the conclusion of the procedure a ribbon shaped tissue marker clip was deployed into the biopsy cavity. Lesion #2 quadrant: Lower-inner quadrant Using sterile technique and 1% Lidocaine as local anesthetic, under direct ultrasound visualization, a 14 gauge spring-loaded device was used to perform biopsy of a mass in the right breast at 3:30 using an inferior approach. At the conclusion of the procedure a coil shaped tissue marker clip was deployed into the biopsy cavity. Lesion #3 quadrant: Right axilla Using sterile technique and 1% Lidocaine as local anesthetic, under direct ultrasound visualization, a 14 gauge spring-loaded device was used to perform biopsy of a right axillary lymph node using an inferior approach. At the conclusion of the procedure a spiral shaped tissue marker clip was deployed into the biopsy cavity. Follow up 2 view mammogram was performed and dictated separately. IMPRESSION: 1.  Ultrasound guided biopsy of a right breast mass at 4:30. 2.  Ultrasound-guided biopsy of a right breast mass at 3:30. 3.  Ultrasound-guided biopsy of a right axillary lymph node. No apparent complications. Electronically Signed: By: Ammie Ferrier M.D. On: 10/20/2017 14:13       IMPRESSION/PLAN: 1. Stage IIA, cT2 N1 M0, grade 2, ER/PR positive Invasive lobular carcinoma, mammary carcinoma in situ of the right breast. Dr. Sondra Come discusses the pathology findings and reviews the nature of invasive lobular breast disease. The consensus from the breast conference includes proceeding with an MRI for extent of disease. Though her lobular phenotype may not yield as great a result, Dr. Lindi Adie has recommended Mammaprint testing. If her MRI results show no additional adenopathy and her Mammaprint is low, she could consider neoadjuvant hormone blockade versus chemotherapy. She is a  candidate for either breast conservation with lumpectomy with nodal sampling with her ALND versus targeted dissecction. Following any systemic therapy, she would be a candidate for radiotherapy to the breast or chest wall and regional adenopathy. We discussed the risks, benefits, short, and long term effects of radiotherapy, and the patient is interested in proceeding. Dr. Sondra Come discusses the delivery and logistics of radiotherapy and anticipates a course of 6 to 6 1/2  weeks. We will see her back about 2 weeks after surgery to move forward with the simulation and planning process and anticipate starting radiotherapy about 4 weeks after surgery.  2. Possible genetic predisposition to malignancy. The patient is interested in genetic counseling and we will coordinate her assessment.   The above documentation reflects my direct findings during this shared patient visit.   Carola Rhine, PAC  -----------------------------------  Blair Promise, PhD, MD  This document serves as a record of services personally performed by Gery Pray, MD and Shona Simpson, PA-C. It was created on their behalf by Bethann Humble, a trained medical scribe. The creation of this record is based on the scribe's personal observations and the provider's statements to them. This document has been checked and approved by the attending provider.

## 2017-10-28 NOTE — Progress Notes (Signed)
Clinical Social Work [place] Psychosocial Distress Screening [place]  Patient completed distress screening protocol and scored a 0 on the Psychosocial Distress Thermometer which indicates no distress. Clinical Social Worker met with patient and patients family in [facility] to assess for distress and other psychosocial needs. Patient stated she positive after meeting with the treatment team and getting more information on her treatment plan. CSW and patient discussed common feeling and emotions when being diagnosed with cancer, and the importance of support during treatment. CSW informed patient of the support team and support services at [facility]. CSW provided contact information and encouraged patient to call with any questions or concerns.  ONCBCN DISTRESS SCREENING 10/28/2017  Screening Type Initial Screening  Distress experienced in past week (1-10) 0     Brandy Thornton, MSW, LCSW, OSW-C Clinical Social Worker [place] [phone]

## 2017-10-28 NOTE — Therapy (Signed)
[place], [place], [address] Phone: [phone]   Fax:  [phone]  Physical Therapy Evaluation  Patient Details  Name: Brandy Thornton MRN: [id number] Date of Birth: Sep 16, 1955 Referring Provider: Dr. [name] Overall   Encounter Date: 10/28/2017  PT End of Session - 10/28/17 1155    Visit Number  1    Number of Visits  1    PT Start Time  56    PT Stop Time  1034 Also saw pt from 1042-1100 for a total of 27 minutes    PT Time Calculation (min)  9 min    Activity Tolerance  Patient tolerated treatment well    Behavior During Therapy  [facility] for tasks assessed/performed       Past Medical History:  Diagnosis Date  . Benign hypertension   . Bilateral carpal tunnel syndrome   . Fatigue   . Hot flashes     Past Surgical History:  Procedure Laterality Date  . BREAST EXCISIONAL BIOPSY Bilateral   . FOOT SURGERY    . KNEE SURGERY    . PARTIAL HYSTERECTOMY      There were no vitals filed for this visit.   Subjective Assessment - 10/28/17 1149    Subjective  Patient reports she is here today to be seen by her medical team for her newly diagnosed right breast cancer.    Patient is accompained by:  Family member    Pertinent History  Patient was diagnosed on 10/14/17 with right invasive lobular carcinoma breast cancer. There are 2 areas that measure 2.8 cm and 1.1 cm in the lower inner quadrant. They are ER/PR positive and HER2 negative with a Ki67 of 40%. She has a biopsied positive axillary lymph node.     Patient Stated Goals  Reduce lymphedema risk and learn post op shoulder ROM HEP    Currently in Pain?  No/denies         [facility] PT Assessment - 10/28/17 0001      Assessment   Medical Diagnosis  Right breast cancer    Referring Provider  Dr. [name] Overall    Onset Date/Surgical Date  10/14/17    Hand Dominance  Right    Prior Therapy  none      Precautions   Precautions  Other (comment)     Precaution Comments  active cancer      Restrictions   Weight Bearing Restrictions  No      Balance Screen   Has the patient fallen in the past 6 months  No    Has the patient had a decrease in activity level because of a fear of falling?   No    Is the patient reluctant to leave their home because of a fear of falling?   No      Home Environment   Living Environment  Private residence    Living Arrangements  Alone    Available Help at Discharge  Family      Prior Function   Level of Independence  Independent    Vocation  Full time employment    Vocation Requirements  HR manager at [employer]  She does cardio (elliptical) for 45 min 3x/week      Cognition   Overall Cognitive Status  Within Functional Limits for tasks assessed      Posture/Postural Control   Posture/Postural Control  Postural limitations    Postural Limitations  Rounded  Shoulders;Forward head      ROM / Strength   AROM / PROM / Strength  AROM;Strength      AROM   AROM Assessment Site  Shoulder;Cervical    Right/Left Shoulder  Right;Left    Right Shoulder Extension  52 Degrees    Right Shoulder Flexion  169 Degrees    Right Shoulder ABduction  169 Degrees    Right Shoulder Internal Rotation  79 Degrees    Right Shoulder External Rotation  88 Degrees    Left Shoulder Extension  50 Degrees    Left Shoulder Flexion  162 Degrees    Left Shoulder ABduction  170 Degrees    Left Shoulder Internal Rotation  68 Degrees    Left Shoulder External Rotation  90 Degrees    Cervical Flexion  WNL    Cervical Extension  WNL    Cervical - Right Side Bend  WNL    Cervical - Left Side Bend  WNL    Cervical - Right Rotation  WNL    Cervical - Left Rotation  WNL      Strength   Overall Strength  Within functional limits for tasks performed        LYMPHEDEMA/ONCOLOGY QUESTIONNAIRE - 10/28/17 1153      Type   Cancer Type  Right breast cancer      Lymphedema Assessments   Lymphedema  Assessments  Upper extremities      Right Upper Extremity Lymphedema   10 cm Proximal to Olecranon Process  28.9 cm    Olecranon Process  24 cm    10 cm Proximal to Ulnar Styloid Process  20.7 cm    Just Proximal to Ulnar Styloid Process  14.5 cm    Across Hand at [employer]  17.9 cm    At [place] of 2nd Digit  6 cm      Left Upper Extremity Lymphedema   10 cm Proximal to Olecranon Process  30.2 cm    Olecranon Process  24.2 cm    10 cm Proximal to Ulnar Styloid Process  19.3 cm    Just Proximal to Ulnar Styloid Process  14.1 cm    Across Hand at [employer]  17.5 cm    At [place] of 2nd Digit  5.8 cm          Objective measurements completed on examination: See above findings.    Patient was instructed today in a home exercise program today for post op shoulder range of motion. These included active assist shoulder flexion in sitting, scapular retraction, wall walking with shoulder abduction, and hands behind head external rotation.  She was encouraged to do these twice a day, holding 3 seconds and repeating 5 times when permitted by her physician.      PT Education - 10/28/17 1154    Education provided  Yes    Education Details  Lymphedema risk reduction and post op shoulder ROM HEP    Person(s) Educated  Patient;Parent(s)    Methods  Explanation;Demonstration;Handout    Comprehension  Returned demonstration;Verbalized understanding           Breast Clinic Goals - 10/28/17 1159      Patient will be able to verbalize understanding of pertinent lymphedema risk reduction practices relevant to her diagnosis specifically related to skin care.   Time  1  (Pended)     Period  Days  (Pended)     Status  Achieved  (Pended)  Patient will be able to return demonstrate and/or verbalize understanding of the post-op home exercise program related to regaining shoulder range of motion.   Time  1  (Pended)     Period  Days  (Pended)     Status  Achieved  (Pended)        Patient will be able to verbalize understanding of the importance of attending the postoperative After Breast Cancer Class for further lymphedema risk reduction education and therapeutic exercise.   Time  1  (Pended)     Period  Days  (Pended)     Status  Achieved  (Pended)             Plan - 10/28/17 1155    Clinical Impression Statement  Patient was diagnosed on 10/14/17 with right invasive lobular carcinoma breast cancer. There are 2 areas that measure 2.8 cm and 1.1 cm in the lower inner quadrant. They are ER/PR positive and HER2 negative with a Ki67 of 40%. She has a biopsied positive axillary lymph node. Her multidisciplinary medical team met prior to her assessments to determine a recommended treatment plan. She is planning to have Mammaprint testing to determine need for chemo. If the mammaprint comes back low, she will undergo neoadjuvant hormone therapy and if it is high, she will undergo neoadjuvant chemotherapy. This will be followed by a possible right lumpectomy and targeted axillary node dissection and radiation, anti-estrogen therapy. She will benefit from a post op PT visit to determine needs.    History and Personal Factors relevant to plan of care:  Lives alone    Clinical Presentation  Stable    Clinical Decision Making  Low    Rehab Potential  Excellent    Clinical Impairments Affecting Rehab Potential  None    PT Frequency  One time visit    PT Treatment/Interventions  ADLs/Self Care Home Management;Therapeutic exercise;Patient/family education    PT Next Visit Plan  Will reassess post operatively if MD refers    PT Home Exercise Plan  Post op shoulder ROM HEP    Consulted and Agree with Plan of Care  Patient;Family member/caregiver    Family Member Consulted  sister, mother       Patient will benefit from skilled therapeutic intervention in order to improve the following deficits and impairments:  Decreased knowledge of precautions, Impaired UE functional use,  Postural dysfunction, Decreased range of motion, Pain  Visit Diagnosis: Malignant neoplasm of lower-inner quadrant of right breast of female, estrogen receptor positive ([place]) - Plan: PT plan of care cert/re-cert  Abnormal posture - Plan: PT plan of care cert/re-cert   Patient will follow up at outpatient cancer rehab 3-4 weeks following surgery.  If the patient requires physical therapy at that time, a specific plan will be dictated and sent to the referring physician for approval. The patient was educated today on appropriate basic range of motion exercises to begin post operatively and the importance of attending the After Breast Cancer class following surgery.  Patient was educated today on lymphedema risk reduction practices as it pertains to recommendations that will benefit the patient immediately following surgery.  She verbalized good understanding.      Problem List Patient Active Problem List   Diagnosis Date Noted  . Malignant neoplasm of lower-inner quadrant of right breast of female, estrogen receptor positive ([place]) 10/22/2017  . Chondromalacia of left knee 06/02/2016    Annia Friendly, PT 10/28/17 12:45 PM  [facility] [phone]  [place], [place], [address] Phone: [phone]   Fax:  [phone]  Name: NADIA VIAR MRN: [id number] Date of Birth: 1956-01-02

## 2017-10-28 NOTE — Progress Notes (Signed)
Nutrition Assessment  Reason for Assessment:  Pt seen in Breast Clinic  ASSESSMENT:   62 year old female with new diagnosis of breast cancer.  Past medical history reviewed.  Patient reports normal appetite.  Medications:  reviewed  Labs: reviewed  Anthropometrics:   Height: 66 inches Weight: 175 lb BMI: 28   NUTRITION DIAGNOSIS: Food and nutrition related knowledge deficit related to new diagnosis of breast cancer as evidenced by no prior need for nutrition related information.  INTERVENTION:   Discussed and provided packet of information regarding nutritional tips for breast cancer patients.  Questions answered.  Teachback method used.  Contact information provided and patient knows to contact me with questions/concerns.    MONITORING, EVALUATION, and GOAL: Pt will consume a healthy plant based diet to maintain lean body mass throughout treatment.   Siddhartha Hoback B. Zenia Resides, [place], [place] Registered Dietitian [phone] (pager)

## 2017-10-28 NOTE — Assessment & Plan Note (Signed)
10/20/2017: Palpable right breast mass LIQ: At 430: 2.8 cm, at 330 position: 1.1 cm, right axillary lymph node: Biopsy of all 3 revealed grade 2 invasive lobular cancer ER 80%, PR 70%, Ki-67 40%, HER-2 negative ratio 1.74, T2N1 stage II a AJCC 8  Pathology and radiology counseling: Discussed with the patient, the details of pathology including the type of breast cancer,the clinical staging, the significance of ER, PR and HER-2/neu receptors and the implications for treatment. After reviewing the pathology in detail, we proceeded to discuss the different treatment options between surgery, radiation, chemotherapy, antiestrogen therapies.  Recommendation: 1. Breast MRI 2. Mammaprint testing to determine if she would benefit from systemic chemotherapy.  If she is high risk then we would offer her neoadjuvant chemotherapy.  If she is low risk we would offer her neoadjuvant antiestrogen therapy. 3.  Patient fully understands that if during surgery there were more than 3 enlarged lymph nodes then she may still need systemic chemotherapy even if Mammaprint was low risk 4.  Followed by breast conserving surgery 5.  Followed by radiation  6.  Followed by antiestrogen therapy and evaluation for Natalee clinical trial  I recommended that we start her on antiestrogen therapy as of today. Return to clinic based upon Mammaprint test results.

## 2017-10-28 NOTE — Patient Instructions (Signed)

## 2017-10-28 NOTE — Progress Notes (Signed)
[place] NOTE  Patient Care Team: Pa, [place] as PCP - General (Family Medicine) [name] Overall, MD as Consulting Physician (General Surgery) Nicholas Lose, MD as Consulting Physician (Hematology and Oncology) Gery Pray, MD as Consulting Physician (Radiation Oncology)  CHIEF COMPLAINTS/PURPOSE OF CONSULTATION:  Newly diagnosed breast cancer  HISTORY OF PRESENTING ILLNESS:  Brandy Thornton 62 y.o. female is here because of recent diagnosis of right breast cancer.  Patient felt a lump in the right breast in the middle of January.  She subsequently had a mammogram and ultrasound.  Imaging identified 2 areas of concern.  All of them are in the left breast lower inner quadrant.  At 430 position 2.8 cm lesion, at 330 position 1.1 cm lesion. There was also a lymph node in the axilla.  Biopsy of all 3 of them was positive for grade 2 invasive lobular cancer ER 80% PR 70% HER-2 negative Ki-67 at 40%.  The 2 tumors in the breast were 3 cm apart.  She was presented this morning in the multidisciplinary tumor board and she is here today accompanied by her family to discuss treatment options. Patient's sister Brandy Thornton is a patient of mine who is a long-term survivor. She has a 35 year old son.  I reviewed her records extensively and collaborated the history with the patient.  SUMMARY OF ONCOLOGIC HISTORY:   Malignant neoplasm of lower-inner quadrant of right breast of female, estrogen receptor positive ([place])   10/20/2017 Initial Diagnosis    Palpable right breast mass LIQ: At 430: 2.8 cm, at 330 position: 1.1 cm, right axillary lymph node: Biopsy of all 3 revealed grade 2 invasive lobular cancer ER 80%, PR 70%, Ki-67 40%, HER-2 negative ratio 1.74, T2N1 stage II a AJCC 8       MEDICAL HISTORY:  Past Medical History:  Diagnosis Date  . Benign hypertension   . Bilateral carpal tunnel syndrome   . Fatigue   . Hot flashes     SURGICAL  HISTORY: Past Surgical History:  Procedure Laterality Date  . BREAST EXCISIONAL BIOPSY Bilateral   . FOOT SURGERY    . KNEE SURGERY    . PARTIAL HYSTERECTOMY      SOCIAL HISTORY: Social History   Socioeconomic History  . Marital status: Single    Spouse name: Not on file  . Number of children: Not on file  . Years of education: Not on file  . Highest education level: Not on file  Social Needs  . Financial resource strain: Not on file  . Food insecurity - worry: Not on file  . Food insecurity - inability: Not on file  . Transportation needs - medical: Not on file  . Transportation needs - non-medical: Not on file  Occupational History  . Not on file  Tobacco Use  . Smoking status: Never Smoker  . Smokeless tobacco: Never Used  Substance and Sexual Activity  . Alcohol use: Yes  . Drug use: No  . Sexual activity: Not on file  Other Topics Concern  . Not on file  Social History Narrative  . Not on file    FAMILY HISTORY: Family History  Problem Relation Age of Onset  . Breast cancer Sister 6    ALLERGIES:  has no active allergies.  MEDICATIONS:  Current Outpatient Medications  Medication Sig Dispense Refill  . PRESCRIPTION MEDICATION     . valsartan-hydrochlorothiazide (DIOVAN-HCT) 320-12.5 MG per tablet Take 1 tablet by mouth daily.    Marland Kitchen  letrozole (FEMARA) 2.5 MG tablet Take 1 tablet (2.5 mg total) by mouth daily. 90 tablet 3   No current facility-administered medications for this visit.     REVIEW OF SYSTEMS:   Constitutional: Denies fevers, chills or abnormal night sweats Eyes: Denies blurriness of vision, double vision or watery eyes Ears, nose, mouth, throat, and face: Denies mucositis or sore throat Respiratory: Denies cough, dyspnea or wheezes Cardiovascular: Denies palpitation, chest discomfort or lower extremity swelling Gastrointestinal:  Denies nausea, heartburn or change in bowel habits Skin: Denies abnormal skin rashes Lymphatics: Denies new  lymphadenopathy or easy bruising Neurological:Denies numbness, tingling or new weaknesses Behavioral/Psych: Mood is stable, no new changes  Breast: Palpable lump in the right breast All other systems were reviewed with the patient and are negative.  PHYSICAL EXAMINATION: ECOG PERFORMANCE STATUS: 0 - Asymptomatic  Vitals:   10/28/17 0901  BP: (!) 147/99  Pulse: 88  Resp: 18  Temp: 98 F (36.7 C)  SpO2: 99%   Filed Weights   10/28/17 0901  Weight: 170 lb 11.2 oz (77.4 kg)    GENERAL:alert, no distress and comfortable SKIN: skin color, texture, turgor are normal, no rashes or significant lesions EYES: normal, conjunctiva are pink and non-injected, sclera clear OROPHARYNX:no exudate, no erythema and lips, buccal mucosa, and tongue normal  NECK: supple, thyroid normal size, non-tender, without nodularity LYMPH:  no palpable lymphadenopathy in the cervical, axillary or inguinal LUNGS: clear to auscultation and percussion with normal breathing effort HEART: regular rate & rhythm and no murmurs and no lower extremity edema ABDOMEN:abdomen soft, non-tender and normal bowel sounds Musculoskeletal:no cyanosis of digits and no clubbing  PSYCH: alert & oriented x 3 with fluent speech NEURO: no focal motor/sensory deficits BREAST: Right breast palpable lump. No palpable axillary or supraclavicular lymphadenopathy (exam performed in the presence of a chaperone)   LABORATORY DATA:  I have reviewed the data as listed Lab Results  Component Value Date   WBC 6.5 10/28/2017   HGB 10.6 (L) 01/29/2007   HCT 36.4 10/28/2017   MCV 79.9 10/28/2017   PLT 448 (H) 10/28/2017   Lab Results  Component Value Date   NA 141 10/28/2017   K 4.2 10/28/2017   CL 104 10/28/2017   CO2 27 10/28/2017    RADIOGRAPHIC STUDIES: I have personally reviewed the radiological reports and agreed with the findings in the report.  ASSESSMENT AND PLAN:  Malignant neoplasm of lower-inner quadrant of right  breast of female, estrogen receptor positive ([place]) 10/20/2017: Palpable right breast mass LIQ: At 430: 2.8 cm, at 330 position: 1.1 cm, right axillary lymph node: Biopsy of all 3 revealed grade 2 invasive lobular cancer ER 80%, PR 70%, Ki-67 40%, HER-2 negative ratio 1.74, T2N1 stage II a AJCC 8  Pathology and radiology counseling: Discussed with the patient, the details of pathology including the type of breast cancer,the clinical staging, the significance of ER, PR and HER-2/neu receptors and the implications for treatment. After reviewing the pathology in detail, we proceeded to discuss the different treatment options between surgery, radiation, chemotherapy, antiestrogen therapies.  Recommendation: 1. Breast MRI 2. Mammaprint testing to determine if she would benefit from systemic chemotherapy.  If she is high risk then we would offer her neoadjuvant chemotherapy.  If she is low risk we would offer her neoadjuvant antiestrogen therapy. 3.  Patient fully understands that if during surgery there were more than 3 enlarged lymph nodes then she may still need systemic chemotherapy even if Mammaprint was low risk  4.  Followed by breast conserving surgery 5.  Followed by radiation  6.  Followed by antiestrogen therapy and evaluation for Natalee clinical trial  I recommended that we start her on antiestrogen therapy as of today.  I sent a prescription for letrozole 2.5 mg daily Letrozole counseling: We discussed the risks and benefits of anti-estrogen therapy with aromatase inhibitors. These include but not limited to insomnia, hot flashes, mood changes, vaginal dryness, bone density loss, and weight gain. We strongly believe that the benefits far outweigh the risks. Patient understands these risks and consented to starting treatment.  Return to clinic based upon Mammaprint test results. If she is low risk and she remains on antiestrogen therapy, she will need a mammogram and ultrasound in 3  months.  All questions were answered. The patient knows to call the clinic with any problems, questions or concerns.    Harriette Ohara, MD 10/28/17

## 2017-10-29 ENCOUNTER — Encounter: Payer: Self-pay | Admitting: Genetic Counselor

## 2017-10-29 ENCOUNTER — Inpatient Hospital Stay (HOSPITAL_BASED_OUTPATIENT_CLINIC_OR_DEPARTMENT_OTHER): Payer: BLUE CROSS/BLUE SHIELD | Admitting: Genetic Counselor

## 2017-10-29 DIAGNOSIS — C50311 Malignant neoplasm of lower-inner quadrant of right female breast: Secondary | ICD-10-CM

## 2017-10-29 DIAGNOSIS — Z800 Family history of malignant neoplasm of digestive organs: Secondary | ICD-10-CM

## 2017-10-29 DIAGNOSIS — Z8042 Family history of malignant neoplasm of prostate: Secondary | ICD-10-CM | POA: Diagnosis not present

## 2017-10-29 DIAGNOSIS — C773 Secondary and unspecified malignant neoplasm of axilla and upper limb lymph nodes: Secondary | ICD-10-CM | POA: Diagnosis not present

## 2017-10-29 DIAGNOSIS — Z803 Family history of malignant neoplasm of breast: Secondary | ICD-10-CM | POA: Insufficient documentation

## 2017-10-29 NOTE — Progress Notes (Signed)
[facility]      Initial Visit   Patient Name: Brandy Thornton Patient DOB: [birth year] Patient Age: 62 y.o. Encounter Date: 10/29/2017  Referring Provider: Nicholas Lose, MD  Primary Care Provider: Carol Ada, MD  Reason for Visit: Evaluate for hereditary susceptibility to cancer    Assessment and Plan:  . Brandy Thornton's history is not highly suggestive of a hereditary predisposition to cancer, but given the breast cancer in her sister and that her sister only had genetic testing of reportedly 5 genes, a genetic evaluation of Brandy Thornton is indicated.  . Testing is recommended to determine whether she has a pathogenic mutation that will impact her screening and risk-reduction for cancer. A negative result will be reassuring.  . Brandy Thornton wished to pursue genetic testing and a blood sample will be sent for analysis of the 23 genes on [facility]'s Breast/GYN panel (ATM, BARD1, BRCA1, BRCA2, BRIP1, CDH1, CHEK2, DICER1, EPCAM, MLH1,  MSH2, MSH6, NBN, NF1, PALB2, PMS2, PTEN, RAD50, RAD51C, RAD51D,SMARCA4, STK11, and TP53).   Marland Kitchen Results should be available in approximately 2-3 weeks, at which point we will contact her and address implications for her as well as address genetic testing for at-risk family members, if needed.     Dr. Lindi Adie was available for questions concerning this case. Total time spent by me in face-to-face counseling was approximately 30 minutes.   _____________________________________________________________________   History of Present Illness: Brandy Thornton, a 62 y.o. female, is being seen at the [facility] due to a personal and family history of cancer. She presents to clinic today to discuss the possibility of a hereditary predisposition to cancer and discuss whether genetic testing is warranted.  Brandy Thornton was recently diagnosed with breast cancer at the age of 46. She has not yet made final decisions regarding  her treatment plan. She is awaiting results of OncotypeDx and is scheduled for a breast MRI on 11/02/17.      Malignant neoplasm of lower-inner quadrant of right breast of female, estrogen receptor positive ([place])   10/20/2017 Initial Diagnosis    Palpable right breast mass LIQ: At 430: 2.8 cm, at 330 position: 1.1 cm, right axillary lymph node: Biopsy of all 3 revealed grade 2 invasive lobular cancer ER 80%, PR 70%, Ki-67 40%, HER-2 negative ratio 1.74, T2N1 stage II a AJCC 8       Past Medical History:  Diagnosis Date  . Benign hypertension   . Bilateral carpal tunnel syndrome   . Family history of breast cancer   . Fatigue   . Hot flashes     Past Surgical History:  Procedure Laterality Date  . BREAST EXCISIONAL BIOPSY Bilateral   . FOOT SURGERY    . KNEE SURGERY    . PARTIAL HYSTERECTOMY      Social History   Socioeconomic History  . Marital status: Single    Spouse name: Not on file  . Number of children: Not on file  . Years of education: Not on file  . Highest education level: Not on file  Occupational History  . Not on file  Social Needs  . Financial resource strain: Not on file  . Food insecurity:    Worry: Not on file    Inability: Not on file  . Transportation needs:    Medical: Not on file    Non-medical: Not on file  Tobacco Use  . Smoking status: Never Smoker  .  Smokeless tobacco: Never Used  Substance and Sexual Activity  . Alcohol use: Yes  . Drug use: No  . Sexual activity: Not on file  Lifestyle  . Physical activity:    Days per week: Not on file    Minutes per session: Not on file  . Stress: Not on file  Relationships  . Social connections:    Talks on phone: Not on file    Gets together: Not on file    Attends religious service: Not on file    Active member of club or organization: Not on file    Attends meetings of clubs or organizations: Not on file    Relationship status: Not on file  Other Topics Concern  . Not on file  Social  History Narrative  . Not on file     Family History:  During the visit, a 4-generation pedigree was obtained. Family tree will be scanned in the Media tab in Epic  Significant diagnoses include the following:  Family History  Problem Relation Age of Onset  . Breast cancer Sister 77       currently 60  . Prostate cancer Father 73       currently 7  . Stomach cancer Maternal Grandfather        dx 41s; deceased 58s    Additionally, Brandy Thornton has one son (ages 71). She has one sister (noted above) who has no children. Reportedly her sister had negative genetic testing, but only 5 genes were analyzed. Results were not available today to review. Brandy Thornton mother (age 72) had 16 sisters and a brother; all passed away in their 55s-90s; cancer-free. Her father has 3 sisters (ages 26, 54, and 19).  Brandy Thornton ancestry is African American. There is no known Jewish ancestry and no consanguinity.  Discussion: We reviewed the characteristics, features and inheritance patterns of hereditary cancer syndromes. We discussed her risk of harboring a mutation in the context of her personal and family history. We discussed the process of genetic testing, insurance coverage and implications of results: positive, negative and variant of unknown significance (VUS).    Brandy Thornton questions were answered to her satisfaction today and she is welcome to call with any additional questions or concerns. Thank you for the referral and allowing [place] to share in the care of your patient.    Steele Berg, MS, [place] Certified Genetic Counselor phone: [phone] [email or web address]

## 2017-11-02 ENCOUNTER — Ambulatory Visit (HOSPITAL_COMMUNITY)
Admission: RE | Admit: 2017-11-02 | Discharge: 2017-11-02 | Disposition: A | Discharge status: Home / Self Care | Payer: BLUE CROSS/BLUE SHIELD | Source: Ambulatory Visit | Attending: Hematology and Oncology | Admitting: Hematology and Oncology

## 2017-11-02 DIAGNOSIS — C50311 Malignant neoplasm of lower-inner quadrant of right female breast: Secondary | ICD-10-CM | POA: Diagnosis not present

## 2017-11-02 DIAGNOSIS — Z170 Estrogen receptor positive status [ER+]: Secondary | ICD-10-CM | POA: Insufficient documentation

## 2017-11-02 DIAGNOSIS — C50911 Malignant neoplasm of unspecified site of right female breast: Secondary | ICD-10-CM | POA: Diagnosis not present

## 2017-11-02 MED ORDER — GADOBENATE DIMEGLUMINE 529 MG/ML IV SOLN
20.0000 mL | Freq: Once | INTRAVENOUS | Status: AC | PRN
  Administered 2017-11-02: 19 mL via INTRAVENOUS

## 2017-11-04 ENCOUNTER — Telehealth: Payer: Self-pay | Admitting: *Deleted

## 2017-11-04 DIAGNOSIS — C50311 Malignant neoplasm of lower-inner quadrant of right female breast: Secondary | ICD-10-CM

## 2017-11-04 DIAGNOSIS — Z170 Estrogen receptor positive status [ER+]: Secondary | ICD-10-CM

## 2017-11-04 NOTE — Telephone Encounter (Signed)
Received mammaprint results of low risk.  Patient aware.   

## 2017-11-05 ENCOUNTER — Encounter: Payer: Self-pay | Admitting: Genetic Counselor

## 2017-11-05 ENCOUNTER — Encounter: Payer: Self-pay | Admitting: Hematology and Oncology

## 2017-11-05 DIAGNOSIS — Z1379 Encounter for other screening for genetic and chromosomal anomalies: Secondary | ICD-10-CM

## 2017-11-05 HISTORY — DX: Encounter for other screening for genetic and chromosomal anomalies: Z13.79

## 2017-11-05 NOTE — Progress Notes (Signed)
[facility]       Genetic Test Results    Patient Name: Brandy Thornton Patient DOB: 01/10/1956 Patient Age: 62 y.o. Encounter Date: 11/06/2017  Referring Provider: Nicholas Lose, MD  Primary Care Provider: Carol Ada, MD   [name] was called today to discuss genetic test results. Please see the Genetics note from her visit on 10/29/2017 for a detailed discussion of her personal and family history.  Genetic Testing: At the time of [name]'s visit, she decided to pursue genetic testing of multiple genes associated with hereditary susceptibility to breast and gynecologic cancer. Testing included sequencing and deletion/duplication analysis. Testing did not reveal a pathogenic mutation in any of the genes analyzed.  A copy of the genetic test report will be scanned into Epic under the Media tab.  The genes analyzed were the 23 genes on [facility]'s Breast/GYN panel (ATM, BARD1, BRCA1, BRCA2, BRIP1, CDH1, CHEK2, DICER1, EPCAM, MLH1,  MSH2, MSH6, NBN, NF1, PALB2, PMS2, PTEN, RAD50, RAD51C, RAD51D,SMARCA4, STK11, and TP53).  Since the current test is not perfect, it is possible that there may be a gene mutation that current testing cannot detect, but that chance is small. It is possible that a different genetic factor, which has not yet been discovered or is not on this panel, is responsible for the cancer diagnoses in the family. Again, the likelihood of this is low. No additional testing is recommended at this time for Brandy Thornton.  Cancer Screening: These results suggest that [name]'s cancer was most likely not due to an inherited predisposition. Most cancers happen by chance and this test, along with details of her family history, suggests that her cancer falls into this category. She is recommended to follow the cancer screening guidelines provided by her physician.   Family Members: Family members are at some increased risk of developing cancer, over  the general population risk, simply due to the family history. Women are recommended to have a yearly mammogram beginning at age 65, a yearly clinical breast exam, a yearly gynecologic exam and perform monthly breast self-exams. Colon cancer screening is recommended to begin by age 62 in both men and women, unless there is a family history of colon cancer or colon polyps or an individual has a personal history to warrant initiating screening at a younger age.  Any relative who had cancer at a young age or had a particularly rare cancer may also wish to pursue genetic testing. Genetic counselors can be located in other cities, by visiting the website of the [employer] (ArtistMovie.se) and Field seismologist for a Dietitian by zip code.   Lastly, cancer genetics is a rapidly advancing field and it is possible that new genetic tests will be appropriate for [name] in the future. We encourage her to remain in contact with [place] on an annual basis so we can update her personal and family histories, and let her know of advances in cancer genetics that may benefit the family. Our contact number was provided. [name] is welcome to call anytime with additional questions.     Steele Berg, MS, [place] Certified Genetic Counselor phone: [phone]

## 2017-11-06 ENCOUNTER — Ambulatory Visit: Payer: Self-pay | Admitting: Genetic Counselor

## 2017-11-06 ENCOUNTER — Encounter (HOSPITAL_COMMUNITY): Payer: Self-pay | Admitting: Hematology and Oncology

## 2017-11-06 DIAGNOSIS — Z1379 Encounter for other screening for genetic and chromosomal anomalies: Secondary | ICD-10-CM

## 2017-12-14 ENCOUNTER — Telehealth: Payer: Self-pay | Admitting: Hematology and Oncology

## 2017-12-14 NOTE — Telephone Encounter (Signed)
Called patient regarding 6/26

## 2017-12-30 DIAGNOSIS — I10 Essential (primary) hypertension: Secondary | ICD-10-CM | POA: Diagnosis not present

## 2017-12-30 DIAGNOSIS — C50919 Malignant neoplasm of unspecified site of unspecified female breast: Secondary | ICD-10-CM | POA: Diagnosis not present

## 2018-02-02 ENCOUNTER — Ambulatory Visit
Admission: RE | Admit: 2018-02-02 | Discharge: 2018-02-02 | Disposition: A | Discharge status: Home / Self Care | Payer: BLUE CROSS/BLUE SHIELD | Source: Ambulatory Visit | Attending: Hematology and Oncology | Admitting: Hematology and Oncology

## 2018-02-02 DIAGNOSIS — Z170 Estrogen receptor positive status [ER+]: Secondary | ICD-10-CM

## 2018-02-02 DIAGNOSIS — R922 Inconclusive mammogram: Secondary | ICD-10-CM | POA: Diagnosis not present

## 2018-02-02 DIAGNOSIS — C50311 Malignant neoplasm of lower-inner quadrant of right female breast: Secondary | ICD-10-CM

## 2018-02-02 DIAGNOSIS — N6323 Unspecified lump in the left breast, lower outer quadrant: Secondary | ICD-10-CM | POA: Diagnosis not present

## 2018-02-03 ENCOUNTER — Inpatient Hospital Stay: Payer: BLUE CROSS/BLUE SHIELD | Attending: Hematology and Oncology | Admitting: Hematology and Oncology

## 2018-02-03 ENCOUNTER — Telehealth: Payer: Self-pay | Admitting: Hematology and Oncology

## 2018-02-03 DIAGNOSIS — C50311 Malignant neoplasm of lower-inner quadrant of right female breast: Secondary | ICD-10-CM | POA: Diagnosis not present

## 2018-02-03 DIAGNOSIS — Z170 Estrogen receptor positive status [ER+]: Secondary | ICD-10-CM

## 2018-02-03 NOTE — Assessment & Plan Note (Signed)
10/20/2017: Palpable right breast mass LIQ: At 430: 2.8 cm, at 330 position: 1.1 cm, right axillary lymph node: Biopsy of all 3 revealed grade 2 invasive lobular cancer ER 80%, PR 70%, Ki-67 40%, HER-2 negative ratio 1.74, T2N1 stage II a AJCC 8  Mammaprint: Low risk Recommendation: Antiestrogen therapy with letrozole 2.5 mg daily return to clinic

## 2018-02-03 NOTE — Progress Notes (Signed)
Patient Care Team: Carol Ada, MD as PCP - General (Family Medicine) [name] Overall, MD as Consulting Physician (General Surgery) Nicholas Lose, MD as Consulting Physician (Hematology and Oncology) Gery Pray, MD as Consulting Physician (Radiation Oncology)  DIAGNOSIS:  Encounter Diagnosis  Name Primary?  . Malignant neoplasm of lower-inner quadrant of right breast of female, estrogen receptor positive ([place])     SUMMARY OF ONCOLOGIC HISTORY:   Malignant neoplasm of lower-inner quadrant of right breast of female, estrogen receptor positive ([place])   10/20/2017 Initial Diagnosis    Palpable right breast mass LIQ: At 430: 2.8 cm, at 330 position: 1.1 cm, right axillary lymph node: Biopsy of all 3 revealed grade 2 invasive lobular cancer ER 80%, PR 70%, Ki-67 40%, HER-2 negative ratio 1.74, T2N1 stage II a AJCC 8      10/30/2017 Miscellaneous    Mammaprint: Low risk: 10-year risk of recurrence untreated 10% luminal type A      11/03/2017 -  Anti-estrogen oral therapy    Neoadjuvant antiestrogen therapy with letrozole 2.5 mg daily       CHIEF COMPLIANT: Follow-up on neoadjuvant antiestrogen therapy  INTERVAL HISTORY: Brandy Thornton is a 62 year old with above-mentioned history of right breast cancer who was low risk mamma print and is currently on neoadjuvant antiestrogen therapy.  She is completed 3 months of treatment and underwent a mammogram and ultrasound.  She is here to discuss those results.  She has been tolerating antiestrogen therapy fairly well.  She did have lots of night sweats initially but they appear to have improved.  She denies any arthralgias or myalgias.  REVIEW OF SYSTEMS:   Constitutional: Denies fevers, chills or abnormal weight loss Eyes: Denies blurriness of vision Ears, nose, mouth, throat, and face: Denies mucositis or sore throat Respiratory: Denies cough, dyspnea or wheezes Cardiovascular: Denies palpitation, chest discomfort Gastrointestinal:   Denies nausea, heartburn or change in bowel habits Skin: Denies abnormal skin rashes Lymphatics: Denies new lymphadenopathy or easy bruising Neurological:Denies numbness, tingling or new weaknesses Behavioral/Psych: Mood is stable, no new changes  Extremities: No lower extremity edema Breast: She can palpate the lump in the breast getting smaller. All other systems were reviewed with the patient and are negative.  I have reviewed the past medical history, past surgical history, social history and family history with the patient and they are unchanged from previous note.  ALLERGIES:  has no active allergies.  MEDICATIONS:  Current Outpatient Medications  Medication Sig Dispense Refill  . letrozole (FEMARA) 2.5 MG tablet Take 1 tablet (2.5 mg total) by mouth daily. 90 tablet 3  . PRESCRIPTION MEDICATION     . valsartan-hydrochlorothiazide (DIOVAN-HCT) 320-12.5 MG per tablet Take 1 tablet by mouth daily.     No current facility-administered medications for this visit.     PHYSICAL EXAMINATION: ECOG PERFORMANCE STATUS: 1 - Symptomatic but completely ambulatory  Vitals:   02/03/18 1526  BP: (!) 116/102  Pulse: 92  Resp: 17  Temp: 98.6 F (37 C)  SpO2: 100%   Filed Weights   02/03/18 1526  Weight: 168 lb 12.8 oz (76.6 kg)    GENERAL:alert, no distress and comfortable SKIN: skin color, texture, turgor are normal, no rashes or significant lesions EYES: normal, Conjunctiva are pink and non-injected, sclera clear OROPHARYNX:no exudate, no erythema and lips, buccal mucosa, and tongue normal  NECK: supple, thyroid normal size, non-tender, without nodularity LYMPH:  no palpable lymphadenopathy in the cervical, axillary or inguinal LUNGS: clear to auscultation and percussion with  normal breathing effort HEART: regular rate & rhythm and no murmurs and no lower extremity edema ABDOMEN:abdomen soft, non-tender and normal bowel sounds MUSCULOSKELETAL:no cyanosis of digits and no  clubbing  NEURO: alert & oriented x 3 with fluent speech, no focal motor/sensory deficits EXTREMITIES: No lower extremity edema  LABORATORY DATA:  I have reviewed the data as listed CMP Latest Ref Rng & Units 10/28/2017 01/27/2007  Glucose 70 - 140 mg/dL 102 152(H)  BUN 7 - 26 mg/dL 11 16  Creatinine 0.60 - 1.10 mg/dL 0.74 0.70  Sodium 136 - 145 mmol/L 141 139  Potassium 3.5 - 5.1 mmol/L 4.2 3.5  Chloride 98 - 109 mmol/L 104 108  CO2 22 - 29 mmol/L 27 26  Calcium 8.4 - 10.4 mg/dL 10.4 8.8  Total Protein 6.4 - 8.3 g/dL 8.1 6.9  Total Bilirubin 0.2 - 1.2 mg/dL 0.4 0.4  Alkaline Phos 40 - 150 U/L 60 88  AST 5 - 34 U/L 16 21  ALT 0 - 55 U/L 14 24    Lab Results  Component Value Date   WBC 6.5 10/28/2017   HGB 12.2 10/28/2017   HCT 36.4 10/28/2017   MCV 79.9 10/28/2017   PLT 448 (H) 10/28/2017   NEUTROABS 3.5 10/28/2017    ASSESSMENT & PLAN:  Malignant neoplasm of lower-inner quadrant of right breast of female, estrogen receptor positive ([place]) 10/20/2017: Palpable right breast mass LIQ: At 430: 2.8 cm, at 330 position: 1.1 cm, right axillary lymph node: Biopsy of all 3 revealed grade 2 invasive lobular cancer ER 80%, PR 70%, Ki-67 40%, HER-2 negative ratio 1.74, T2N1 stage II a AJCC 8  Mammaprint: Low risk Recommendation: Antiestrogen therapy with letrozole 2.5 mg daily   Letrozole toxicities: 1.  Night sweats which have improved over time  Mammogram and ultrasound review 02/02/2018: Decrease in size of the 2 lesions in the breast as well as a lymph node.  I suspect that there has been a 30% decrease in the size.  Our plan is to continue with the current treatment plan and see her back in 3 months with a breast MRI and follow-up. I instructed her navigators to place her name for the breast tumor board to discuss in 3 months after the MRI.  Orders Placed This Encounter  Procedures  . MR BREAST BILATERAL W WO CONTRAST INC CAD    Standing Status:   Future    Standing Expiration  Date:   04/06/2019    Order Specific Question:   If indicated for the ordered procedure, I authorize the administration of contrast media per Radiology protocol    Answer:   Yes    Order Specific Question:   What is the patient's sedation requirement?    Answer:   No Sedation    Order Specific Question:   Does the patient have a pacemaker or implanted devices?    Answer:   No    Order Specific Question:   Radiology Contrast Protocol - do NOT remove file path    Answer:   [email or web address]    Order Specific Question:   Preferred imaging location?    Answer:   [facility] (table limit-350 lbs)   The patient has a good understanding of the overall plan. she agrees with it. she will call with any problems that may develop before the next visit here.   Harriette Ohara, MD 02/03/18

## 2018-02-03 NOTE — Telephone Encounter (Signed)
Gave patient avs and calendar of upcoming October appointments.

## 2018-04-26 ENCOUNTER — Ambulatory Visit (HOSPITAL_COMMUNITY)
Admission: RE | Admit: 2018-04-26 | Discharge: 2018-04-26 | Disposition: A | Discharge status: Home / Self Care | Payer: Managed Care, Other (non HMO) | Source: Ambulatory Visit | Attending: Hematology and Oncology | Admitting: Hematology and Oncology

## 2018-04-26 DIAGNOSIS — C50311 Malignant neoplasm of lower-inner quadrant of right female breast: Secondary | ICD-10-CM | POA: Diagnosis present

## 2018-04-26 DIAGNOSIS — Z170 Estrogen receptor positive status [ER+]: Secondary | ICD-10-CM | POA: Insufficient documentation

## 2018-04-26 MED ORDER — GADOBUTROL 1 MMOL/ML IV SOLN
7.5000 mL | Freq: Once | INTRAVENOUS | Status: AC | PRN
  Administered 2018-04-26: 7.5 mL via INTRAVENOUS

## 2018-04-27 LAB — POCT I-STAT CREATININE: CREATININE: 0.7 mg/dL (ref 0.44–1.00)

## 2018-04-28 ENCOUNTER — Telehealth: Payer: Self-pay

## 2018-04-28 NOTE — Telephone Encounter (Signed)
Pt called to see if she can move her appt up to discuss her breast MRI results with Dr.Gudena. She was contacted today by Dr.Newman's office to schedule surgery. Pt wants to wait until further discussion with MD. Dorie Rank up appt to next week, so pt can decide about surgical options. Confirmed time/date for Monday 9/23

## 2018-05-03 ENCOUNTER — Telehealth: Payer: Self-pay | Admitting: Hematology and Oncology

## 2018-05-03 ENCOUNTER — Inpatient Hospital Stay: Payer: Managed Care, Other (non HMO) | Attending: Hematology and Oncology | Admitting: Hematology and Oncology

## 2018-05-03 DIAGNOSIS — C50311 Malignant neoplasm of lower-inner quadrant of right female breast: Secondary | ICD-10-CM | POA: Insufficient documentation

## 2018-05-03 DIAGNOSIS — Z170 Estrogen receptor positive status [ER+]: Secondary | ICD-10-CM | POA: Diagnosis not present

## 2018-05-03 DIAGNOSIS — Z79811 Long term (current) use of aromatase inhibitors: Secondary | ICD-10-CM | POA: Diagnosis not present

## 2018-05-03 NOTE — Assessment & Plan Note (Signed)
10/20/2017:Palpable right breast mass LIQ: At 430: 2.8 cm, at 330 position: 1.1 cm, right axillary lymph node: Biopsy of all 3 revealed grade 2 invasive lobular cancer ER 80%, PR 70%, Ki-67 40%, HER-2 negative ratio 1.74, T2N1 stage II a AJCC 8  Mammaprint: Low risk Recommendation: Antiestrogen therapy with letrozole 2.5 mg daily    Letrozole toxicities: 1.  Night sweats which have improved over time  Mammogram and ultrasound review 02/02/2018: Decrease in size of the 2 lesions in the breast as well as a lymph node.  I suspect that there has been a 30% decrease in the size.  Breast MRI: 04/26/2018.  Degree of enhancement has significantly diminished.  It spans 3.4 x 1.4 x 1.5 cm versus 3.8 x 2.5 x 1.8 cm however the intensity of the enhancement is less confluent.  Mildly prominent right axillary lymph nodes remain unchanged.  I recommended that the patient should now undergo surgery.  She would be candidate for lumpectomy with targeted node dissection.

## 2018-05-03 NOTE — Progress Notes (Signed)
Patient Care Team: Patient, No Pcp Per as PCP - General (General Practice) [name] Overall, MD as Consulting Physician (General Surgery) Nicholas Lose, MD as Consulting Physician (Hematology and Oncology) Gery Pray, MD as Consulting Physician (Radiation Oncology)  DIAGNOSIS:  Encounter Diagnosis  Name Primary?  . Malignant neoplasm of lower-inner quadrant of right breast of female, estrogen receptor positive ([place])     SUMMARY OF ONCOLOGIC HISTORY:   Malignant neoplasm of lower-inner quadrant of right breast of female, estrogen receptor positive ([place])   10/20/2017 Initial Diagnosis    Palpable right breast mass LIQ: At 430: 2.8 cm, at 330 position: 1.1 cm, right axillary lymph node: Biopsy of all 3 revealed grade 2 invasive lobular cancer ER 80%, PR 70%, Ki-67 40%, HER-2 negative ratio 1.74, T2N1 stage II a AJCC 8    10/30/2017 Miscellaneous    Mammaprint: Low risk: 10-year risk of recurrence untreated 10% luminal type A    11/03/2017 -  Anti-estrogen oral therapy    Neoadjuvant antiestrogen therapy with letrozole 2.5 mg daily     CHIEF COMPLIANT: Follow-up to review the results of the breast MRI  INTERVAL HISTORY: Brandy Thornton is a 62 year old with above-mentioned history of right breast cancer who underwent neoadjuvant antiestrogen therapy for 6 months and underwent a breast MRI.  She is tolerating antiestrogen therapy extremely well.  She does not have any major complaints of hot flashes or myalgias.  She is here today accompanied by her boyfriend to discuss the treatment plan.  REVIEW OF SYSTEMS:   Constitutional: Denies fevers, chills or abnormal weight loss Eyes: Denies blurriness of vision Ears, nose, mouth, throat, and face: Denies mucositis or sore throat Respiratory: Denies cough, dyspnea or wheezes Cardiovascular: Denies palpitation, chest discomfort Gastrointestinal:  Denies nausea, heartburn or change in bowel habits Skin: Denies abnormal skin rashes Lymphatics:  Denies new lymphadenopathy or easy bruising Neurological:Denies numbness, tingling or new weaknesses Behavioral/Psych: Mood is stable, no new changes  Extremities: No lower extremity edema Breast:  denies any pain or lumps or nodules in either breasts All other systems were reviewed with the patient and are negative.  I have reviewed the past medical history, past surgical history, social history and family history with the patient and they are unchanged from previous note.  ALLERGIES:  has no active allergies.  MEDICATIONS:  Current Outpatient Medications  Medication Sig Dispense Refill  . letrozole (FEMARA) 2.5 MG tablet Take 1 tablet (2.5 mg total) by mouth daily. 90 tablet 3  . PRESCRIPTION MEDICATION     . valsartan-hydrochlorothiazide (DIOVAN-HCT) 320-12.5 MG per tablet Take 1 tablet by mouth daily.     No current facility-administered medications for this visit.     PHYSICAL EXAMINATION: ECOG PERFORMANCE STATUS: 1 - Symptomatic but completely ambulatory  Vitals:   05/03/18 0849  BP: (!) 168/70  Pulse: 94  Resp: 18  Temp: 98.5 F (36.9 C)  SpO2: 98%   Filed Weights   05/03/18 0849  Weight: 166 lb 9.6 oz (75.6 kg)    GENERAL:alert, no distress and comfortable SKIN: skin color, texture, turgor are normal, no rashes or significant lesions EYES: normal, Conjunctiva are pink and non-injected, sclera clear OROPHARYNX:no exudate, no erythema and lips, buccal mucosa, and tongue normal  NECK: supple, thyroid normal size, non-tender, without nodularity LYMPH:  no palpable lymphadenopathy in the cervical, axillary or inguinal LUNGS: clear to auscultation and percussion with normal breathing effort HEART: regular rate & rhythm and no murmurs and no lower extremity edema ABDOMEN:abdomen soft,  non-tender and normal bowel sounds MUSCULOSKELETAL:no cyanosis of digits and no clubbing  NEURO: alert & oriented x 3 with fluent speech, no focal motor/sensory deficits EXTREMITIES: No  lower extremity edema    LABORATORY DATA:  I have reviewed the data as listed CMP Latest Ref Rng & Units 04/26/2018 10/28/2017 01/27/2007  Glucose 70 - 140 mg/dL - 102 152(H)  BUN 7 - 26 mg/dL - 11 16  Creatinine 0.44 - 1.00 mg/dL 0.70 0.74 0.70  Sodium 136 - 145 mmol/L - 141 139  Potassium 3.5 - 5.1 mmol/L - 4.2 3.5  Chloride 98 - 109 mmol/L - 104 108  CO2 22 - 29 mmol/L - 27 26  Calcium 8.4 - 10.4 mg/dL - 10.4 8.8  Total Protein 6.4 - 8.3 g/dL - 8.1 6.9  Total Bilirubin 0.2 - 1.2 mg/dL - 0.4 0.4  Alkaline Phos 40 - 150 U/L - 60 88  AST 5 - 34 U/L - 16 21  ALT 0 - 55 U/L - 14 24    Lab Results  Component Value Date   WBC 6.5 10/28/2017   HGB 12.2 10/28/2017   HCT 36.4 10/28/2017   MCV 79.9 10/28/2017   PLT 448 (H) 10/28/2017   NEUTROABS 3.5 10/28/2017    ASSESSMENT & PLAN:  Malignant neoplasm of lower-inner quadrant of right breast of female, estrogen receptor positive ([place]) 10/20/2017:Palpable right breast mass LIQ: At 430: 2.8 cm, at 330 position: 1.1 cm, right axillary lymph node: Biopsy of all 3 revealed grade 2 invasive lobular cancer ER 80%, PR 70%, Ki-67 40%, HER-2 negative ratio 1.74, T2N1 stage II a AJCC 8  Mammaprint: Low risk Recommendation: Antiestrogen therapy with letrozole 2.5 mg daily    Letrozole toxicities: 1.  Night sweats which have improved over time  Mammogram and ultrasound review 02/02/2018: Decrease in size of the 2 lesions in the breast as well as a lymph node.  I suspect that there has been a 30% decrease in the size.  Breast MRI: 04/26/2018.  Degree of enhancement has significantly diminished.  It spans 3.4 x 1.4 x 1.5 cm versus 3.8 x 2.5 x 1.8 cm however the intensity of the enhancement is less confluent.  Mildly prominent right axillary lymph nodes remain unchanged.  I recommended that the patient should now undergo surgery.  She could be candidate for lumpectomy with targeted node dissection. We will discuss her in the tumor board and make a  final decision on her axilla.  Return to clinic after surgery to discuss pathology report.  No orders of the defined types were placed in this encounter.  The patient has a good understanding of the overall plan. she agrees with it. she will call with any problems that may develop before the next visit here.   Harriette Ohara, MD 05/03/18

## 2018-05-03 NOTE — Telephone Encounter (Signed)
Per 9/23 los, no orders.  °

## 2018-05-05 ENCOUNTER — Ambulatory Visit: Payer: BLUE CROSS/BLUE SHIELD | Admitting: Hematology and Oncology

## 2018-05-07 ENCOUNTER — Other Ambulatory Visit (HOSPITAL_COMMUNITY): Payer: Self-pay | Admitting: Surgery

## 2018-05-07 DIAGNOSIS — C50911 Malignant neoplasm of unspecified site of right female breast: Secondary | ICD-10-CM

## 2018-05-11 ENCOUNTER — Other Ambulatory Visit (HOSPITAL_COMMUNITY): Payer: Self-pay | Admitting: Surgery

## 2018-05-11 DIAGNOSIS — C50911 Malignant neoplasm of unspecified site of right female breast: Secondary | ICD-10-CM

## 2018-05-12 ENCOUNTER — Ambulatory Visit: Payer: BLUE CROSS/BLUE SHIELD | Admitting: Hematology and Oncology

## 2018-05-12 ENCOUNTER — Other Ambulatory Visit: Payer: Self-pay

## 2018-05-12 ENCOUNTER — Encounter (HOSPITAL_BASED_OUTPATIENT_CLINIC_OR_DEPARTMENT_OTHER): Payer: Self-pay | Admitting: *Deleted

## 2018-05-13 ENCOUNTER — Telehealth: Payer: Self-pay | Admitting: Hematology and Oncology

## 2018-05-13 ENCOUNTER — Encounter (HOSPITAL_BASED_OUTPATIENT_CLINIC_OR_DEPARTMENT_OTHER)
Admission: RE | Admit: 2018-05-13 | Discharge: 2018-05-13 | Disposition: A | Discharge status: Home / Self Care | Payer: Managed Care, Other (non HMO) | Source: Ambulatory Visit | Attending: Surgery | Admitting: Surgery

## 2018-05-13 DIAGNOSIS — Z01818 Encounter for other preprocedural examination: Secondary | ICD-10-CM | POA: Diagnosis present

## 2018-05-13 DIAGNOSIS — I10 Essential (primary) hypertension: Secondary | ICD-10-CM | POA: Insufficient documentation

## 2018-05-13 LAB — BASIC METABOLIC PANEL
Anion gap: 8 (ref 5–15)
BUN: 14 mg/dL (ref 6–20)
CO2: 26 mmol/L (ref 22–32)
Calcium: 9.9 mg/dL (ref 8.9–10.3)
Chloride: 107 mmol/L (ref 98–111)
Creatinine, Ser: 0.66 mg/dL (ref 0.44–1.00)
Glucose, Bld: 93 mg/dL (ref 70–99)
Potassium: 3.9 mmol/L (ref 3.5–5.1)
SODIUM: 141 mmol/L (ref 135–145)

## 2018-05-13 NOTE — Telephone Encounter (Signed)
Scheduled appt per 10/2 sch message - left message for patient with date and time and sent reminder letter in the mail.

## 2018-05-13 NOTE — Progress Notes (Addendum)
Ensure pre surgery drink given with instructions to complete by [facility], surgical soap given with instructions, pt verbalized understanding.  EKG reviewed by Dr. Kerin Perna, will proceed with surgery as scheduled.

## 2018-05-17 ENCOUNTER — Ambulatory Visit
Admission: RE | Admit: 2018-05-17 | Discharge: 2018-05-17 | Disposition: A | Discharge status: Home / Self Care | Payer: Managed Care, Other (non HMO) | Source: Ambulatory Visit | Attending: Surgery | Admitting: Surgery

## 2018-05-17 DIAGNOSIS — C50911 Malignant neoplasm of unspecified site of right female breast: Secondary | ICD-10-CM

## 2018-05-17 NOTE — Anesthesia Preprocedure Evaluation (Addendum)
Anesthesia Evaluation  Patient identified by MRN, date of birth, ID band Patient awake    Reviewed: Allergy & Precautions, H&P , NPO status , Patient's Chart, lab work & pertinent test results  Airway Mallampati: II  TM Distance: >3 FB Neck ROM: Full    Dental no notable dental hx. (+) Teeth Intact, Dental Advisory Given   Pulmonary neg pulmonary ROS,    Pulmonary exam normal breath sounds clear to auscultation       Cardiovascular hypertension, Pt. on medications Normal cardiovascular exam Rhythm:Regular Rate:Normal     Neuro/Psych negative neurological ROS  negative psych ROS   GI/Hepatic   Endo/Other    Renal/GU      Musculoskeletal   Abdominal   Peds  Hematology   Anesthesia Other Findings   Reproductive/Obstetrics                            Anesthesia Physical Anesthesia Plan  ASA: III  Anesthesia Plan: General   Post-op Pain Management:  Regional for Post-op pain   Induction: Intravenous  PONV Risk Score and Plan: 3 and 4 or greater and Treatment may vary due to age or medical condition, Dexamethasone, Ondansetron and Scopolamine patch - Pre-op  Airway Management Planned: LMA  Additional Equipment:   Intra-op Plan:   Post-operative Plan:   Informed Consent: I have reviewed the patients History and Physical, chart, labs and discussed the procedure including the risks, benefits and alternatives for the proposed anesthesia with the patient or authorized representative who has indicated his/her understanding and acceptance.   Dental advisory given  Plan Discussed with:   Anesthesia Plan Comments: (w pecs block)       Anesthesia Quick Evaluation

## 2018-05-17 NOTE — H&P (Signed)
Brandy Thornton  Location: [facility] Patient #: [id number] DOB: 12/14/1955 Single / Language: Brandy Thornton / Race: Black or African American Female  History of Present Illness   The patient is a 62 year old female who presents with a complaint of breast cancer.  The PCP is Dr. Cipriano Mile  The pateint was seen at the Breast [facility] - Oncology is Drs. Lindi Adie and Kinard  She comes by herself.  She has completed her neoadjuvant tx with anti-hormone tx with letrozole. She had a breast MRI on 04/26/2018 - The degree of enhancement has significantly diminished. The abnormal enhancement spans 3.4 by 1.4 by 1.5 cm today versus 3.8 x 2.5 X 1.8 CM previously. However, the intensity of the enhancement is markedly reduced and the enhancement ispatchier and less confluent. Lymph nodes: Mildly prominent right axillary nodes remain, unchanged. No left axillary or internal mammary lymphadenopathy.  She has some skin dimpling when I first saw her, and she says this has resolved. She also cannot feel the mass in her right breast any more. So far, she's been happy with her results.  Right breast cancer: [facility] - 2.8 cm mass at 4:30 o'clock in the right breast, 1.1 cm mass at 3:30 o'clock in the right breast, and an abnormal lymph node Biopsy: of the right breast on 10/20/2017 ([id number]) - 4:30 and 3:30 o'clock right breast - ILC, ER - 80%, PR - 70%, Ki67 - 40%, Her2Neu positive, Lymph node POSITIVE for metastatic disease Family history of breast or ovarian cancer: Her sister, [name], had ductal ca - had a mastectomy - Blackman/Khan/Wentworth/[place] - now followed by Dr. Lindi Adie On hormone therapy: Yes, she is on the patch. She actually had the dose increased which caused breast discomfort in Jan 2019 - so she back the dose to 0.75. She has used it for 3 years.  I discussed the options for breast cancer treatment with the  patient. The patient is at the [facility], which includes medical oncology and radiation oncology. I discussed the surgical options of lumpectomy vs. mastectomy. She wants to go ahead with a right lumpectomy. I spoke to Dr. Rochele Pages, and he suggested two seeds in the right breast. The risks of surgery include, but are not limited to, bleeding, infection, the need for further surgery, and nerve injury. The patient has been given literature on the treatment of breast cancer.  Plan: 1) Anti-hormone tx - letrozole, 2) Right lumpectomy (2 seeds) with right axillary lymph node dissection  Past Medical History: 1. Right breast cancer Biopsy: of the right breast on 10/20/2017 ([id number]) - 4:30 and 3:30 o'clock right breast - ILC, ER - 80%, PR - 70%, Ki67 - 40%, Her2Neu positive, Lymph node POSITIVE for metastatic disease Her mammoprint, 12/20/2017, was low risk. She underwent neoadjuvant anti-hormonal tx - with letrozole  2. Knee fx - 2008 - [place] 3. HTN x 11 years 4. last colonoscopy about 10 years ago 5. Hysterectomy in 1994 for benign tumors 6. Genetics were negative  Social History:  Divorced. Lives by self Has one son: 23 yo, Brandy Thornton She has been accompanied by her mother and sister, [name].  She has a new job - HR at [employer] ([name], [place]; 05/07/2018 11:07 AM) No Known Drug Allergies [05/07/2018]:  Medication History ([name], CMA; 05/07/2018 11:09 AM) Valsartan ('320MG'$  Tablet, Oral) Active. Letrozole (2.'5MG'$  Tablet, Oral) Active. Medications Reconciled  Vitals ([name] CMA; 05/07/2018 11:09 AM)  05/07/2018 11:09 AM Weight: 166.13 lb Height: 66in Body Surface Area: 1.85 m Body Mass Index: 26.81 kg/m  Pulse: 89 (Regular)  BP: 156/98 (Sitting, Left Arm, Standard)  Physical Exam  General: WN AA F alert and generally healthy appearing. She looks younger than her  stated age. Skin: Inspection and palpation of the skin unremarkable.  Eyes: Conjunctivae white, pupils equal. Face, ears, nose, mouth, and throat: Face - normal. Normal ears and nose. Lips and teeth normal.  Neck: Supple. No mass. Trachea midline. No thyroid mass.  Lymph Nodes: No supraclavicular or cervical adenopathy. No axillary adenopathy. particular attention paid to the right axilla, but I could not feel anything abnormal.  Lungs: Normal respiratory effort. Clear to auscultation and symmetric breath sounds. Cardiovascular: Regular rate and rythm. Normal auscultation of the heart. No murmur or rub.  Breast: Right - The previously palpable mass in the right breast is essentially gone. Left - no mass  Abdomen: Soft. No mass. Liver and spleen not palpable. No tenderness. No hernia. Normal bowel sounds.  Umbilical ring. Pfannenstiel incision Rectal: Not done.  Musculoskeletal/extremities: Normal gait. Good strength and ROM in upper and lower extremities.   Neurologic: Grossly intact to motor and sensory function.   Psychiatric: Has normal mood and affect. Judgement and insight appear normal.   Assessment & Plan  1.  BREAST CANCER, STAGE 2, RIGHT (C50.911)  Story: Biopsy: of the right breast on 10/20/2017 ([id number]) - 4:30 and 3:30 o'clock right breast - ILC, ER - 80%, PR - 70%, Ki67 - 40%, Her2Neu positive, Lymph node POSITIVE for metastatic disease  Her mammoprint, 12/20/2017, was low risk.  She underwent neoadjuvant anti-hormonal tx - with letrozole - she has had a moderate response.  Oncology - Lindi Adie and Kinard   Plan:    1) Anti-hormone tx - letrozole   2) Right breast lumpectomy with right axillary lymph node dissection  2. HTN x 11 years 3. Hysterectomy in 1994 for benign tumors 4. Genetics were negative   [name] Overall, MD, [facility] Surgery Pager: [phone] Office phone:   [phone]

## 2018-05-18 ENCOUNTER — Encounter (HOSPITAL_BASED_OUTPATIENT_CLINIC_OR_DEPARTMENT_OTHER)
Admission: RE | Disposition: A | Discharge status: Home / Self Care | Payer: Self-pay | Source: Ambulatory Visit | Attending: Surgery

## 2018-05-18 ENCOUNTER — Ambulatory Visit (HOSPITAL_BASED_OUTPATIENT_CLINIC_OR_DEPARTMENT_OTHER): Payer: Managed Care, Other (non HMO) | Admitting: Anesthesiology

## 2018-05-18 ENCOUNTER — Encounter (HOSPITAL_BASED_OUTPATIENT_CLINIC_OR_DEPARTMENT_OTHER): Payer: Self-pay | Admitting: *Deleted

## 2018-05-18 ENCOUNTER — Ambulatory Visit
Admission: RE | Admit: 2018-05-18 | Discharge: 2018-05-18 | Disposition: A | Discharge status: Home / Self Care | Payer: Managed Care, Other (non HMO) | Source: Ambulatory Visit | Attending: Surgery | Admitting: Surgery

## 2018-05-18 ENCOUNTER — Other Ambulatory Visit: Payer: Self-pay

## 2018-05-18 ENCOUNTER — Ambulatory Visit (HOSPITAL_BASED_OUTPATIENT_CLINIC_OR_DEPARTMENT_OTHER)
Admission: RE | Admit: 2018-05-18 | Discharge: 2018-05-18 | Disposition: A | Discharge status: Home / Self Care | Payer: Managed Care, Other (non HMO) | Source: Ambulatory Visit | Attending: Surgery | Admitting: Surgery

## 2018-05-18 DIAGNOSIS — C50311 Malignant neoplasm of lower-inner quadrant of right female breast: Secondary | ICD-10-CM | POA: Diagnosis present

## 2018-05-18 DIAGNOSIS — C50911 Malignant neoplasm of unspecified site of right female breast: Secondary | ICD-10-CM

## 2018-05-18 DIAGNOSIS — I10 Essential (primary) hypertension: Secondary | ICD-10-CM | POA: Diagnosis not present

## 2018-05-18 DIAGNOSIS — C773 Secondary and unspecified malignant neoplasm of axilla and upper limb lymph nodes: Secondary | ICD-10-CM | POA: Diagnosis not present

## 2018-05-18 DIAGNOSIS — Z170 Estrogen receptor positive status [ER+]: Secondary | ICD-10-CM | POA: Insufficient documentation

## 2018-05-18 DIAGNOSIS — Z79899 Other long term (current) drug therapy: Secondary | ICD-10-CM | POA: Diagnosis not present

## 2018-05-18 HISTORY — PX: BREAST LUMPECTOMY: SHX2

## 2018-05-18 HISTORY — PX: BREAST LUMPECTOMY WITH RADIOACTIVE SEED AND AXILLARY LYMPH NODE DISSECTION: SHX6656

## 2018-05-18 SURGERY — BREAST LUMPECTOMY WITH RADIOACTIVE SEED AND AXILLARY LYMPH NODE DISSECTION
Anesthesia: General | Site: Breast | Laterality: Right

## 2018-05-18 MED ORDER — KETOROLAC TROMETHAMINE 15 MG/ML IJ SOLN: 15.0000 mg | Freq: Once | INTRAMUSCULAR | Status: DC | PRN

## 2018-05-18 MED ORDER — ACETAMINOPHEN 500 MG PO TABS
ORAL_TABLET | ORAL | Status: AC
  Filled 2018-05-18: qty 2

## 2018-05-18 MED ORDER — SCOPOLAMINE 1 MG/3DAYS TD PT72: 1.0000 | MEDICATED_PATCH | Freq: Once | TRANSDERMAL | Status: DC | PRN

## 2018-05-18 MED ORDER — PHENYLEPHRINE HCL 10 MG/ML IJ SOLN
INTRAMUSCULAR | Status: DC | PRN
  Administered 2018-05-18: 80 ug via INTRAVENOUS

## 2018-05-18 MED ORDER — PROPOFOL 10 MG/ML IV BOLUS
INTRAVENOUS | Status: DC | PRN
  Administered 2018-05-18: 150 mg via INTRAVENOUS

## 2018-05-18 MED ORDER — CHLORHEXIDINE GLUCONATE CLOTH 2 % EX PADS: 6.0000 | MEDICATED_PAD | Freq: Once | CUTANEOUS | Status: DC

## 2018-05-18 MED ORDER — FENTANYL CITRATE (PF) 100 MCG/2ML IJ SOLN
50.0000 ug | INTRAMUSCULAR | Status: AC | PRN
  Administered 2018-05-18: 25 ug via INTRAVENOUS
  Administered 2018-05-18: 100 ug via INTRAVENOUS
  Administered 2018-05-18: 25 ug via INTRAVENOUS

## 2018-05-18 MED ORDER — EPHEDRINE SULFATE 50 MG/ML IJ SOLN
INTRAMUSCULAR | Status: DC | PRN
  Administered 2018-05-18 (×2): 10 mg via INTRAVENOUS

## 2018-05-18 MED ORDER — FENTANYL CITRATE (PF) 100 MCG/2ML IJ SOLN: 25.0000 ug | INTRAMUSCULAR | Status: DC | PRN

## 2018-05-18 MED ORDER — BUPIVACAINE-EPINEPHRINE (PF) 0.25% -1:200000 IJ SOLN
INTRAMUSCULAR | Status: DC | PRN
  Administered 2018-05-18: 20 mL

## 2018-05-18 MED ORDER — HYDROCODONE-ACETAMINOPHEN 5-325 MG PO TABS: 1.0000 | ORAL_TABLET | Freq: Four times a day (QID) | ORAL | 0 refills | Status: DC | PRN

## 2018-05-18 MED ORDER — FENTANYL CITRATE (PF) 100 MCG/2ML IJ SOLN
INTRAMUSCULAR | Status: AC
  Filled 2018-05-18: qty 2

## 2018-05-18 MED ORDER — ACETAMINOPHEN 500 MG PO TABS
1000.0000 mg | ORAL_TABLET | ORAL | Status: AC
  Administered 2018-05-18: 1000 mg via ORAL

## 2018-05-18 MED ORDER — GABAPENTIN 300 MG PO CAPS
ORAL_CAPSULE | ORAL | Status: AC
  Filled 2018-05-18: qty 1

## 2018-05-18 MED ORDER — LACTATED RINGERS IV SOLN
INTRAVENOUS | Status: DC
  Administered 2018-05-18 (×3): via INTRAVENOUS

## 2018-05-18 MED ORDER — DEXAMETHASONE SODIUM PHOSPHATE 4 MG/ML IJ SOLN
INTRAMUSCULAR | Status: DC | PRN
  Administered 2018-05-18: 10 mg via INTRAVENOUS

## 2018-05-18 MED ORDER — GABAPENTIN 300 MG PO CAPS
300.0000 mg | ORAL_CAPSULE | ORAL | Status: AC
  Administered 2018-05-18: 300 mg via ORAL

## 2018-05-18 MED ORDER — ONDANSETRON HCL 4 MG/2ML IJ SOLN
INTRAMUSCULAR | Status: AC
  Filled 2018-05-18: qty 2

## 2018-05-18 MED ORDER — CEFAZOLIN SODIUM-DEXTROSE 2-4 GM/100ML-% IV SOLN
2.0000 g | INTRAVENOUS | Status: AC
  Administered 2018-05-18: 2 g via INTRAVENOUS

## 2018-05-18 MED ORDER — ROPIVACAINE HCL 5 MG/ML IJ SOLN
INTRAMUSCULAR | Status: DC | PRN
  Administered 2018-05-18: 30 mL

## 2018-05-18 MED ORDER — MIDAZOLAM HCL 2 MG/2ML IJ SOLN
1.0000 mg | INTRAMUSCULAR | Status: DC | PRN
  Administered 2018-05-18: 2 mg via INTRAVENOUS

## 2018-05-18 MED ORDER — ONDANSETRON HCL 4 MG/2ML IJ SOLN
4.0000 mg | Freq: Once | INTRAMUSCULAR | Status: AC | PRN
  Administered 2018-05-18: 4 mg via INTRAVENOUS

## 2018-05-18 MED ORDER — DEXTROSE 5 % IV SOLN: 3.0000 g | INTRAVENOUS | Status: DC

## 2018-05-18 MED ORDER — CEFAZOLIN SODIUM-DEXTROSE 2-4 GM/100ML-% IV SOLN
INTRAVENOUS | Status: AC
  Filled 2018-05-18: qty 100

## 2018-05-18 MED ORDER — ONDANSETRON HCL 4 MG/2ML IJ SOLN
INTRAMUSCULAR | Status: DC | PRN
  Administered 2018-05-18: 4 mg via INTRAVENOUS

## 2018-05-18 MED ORDER — MIDAZOLAM HCL 2 MG/2ML IJ SOLN
INTRAMUSCULAR | Status: AC
  Filled 2018-05-18: qty 2

## 2018-05-18 SURGICAL SUPPLY — 69 items
ADH SKN CLS APL DERMABOND .7 (GAUZE/BANDAGES/DRESSINGS) ×1
APL SKNCLS STERI-STRIP NONHPOA (GAUZE/BANDAGES/DRESSINGS)
APPLIER CLIP 11 MED OPEN (CLIP) ×2
APPLIER CLIP 9.375 MED OPEN (MISCELLANEOUS)
APR CLP MED 11 20 MLT OPN (CLIP) ×1
APR CLP MED 9.3 20 MLT OPN (MISCELLANEOUS)
BENZOIN TINCTURE PRP APPL 2/3 (GAUZE/BANDAGES/DRESSINGS) IMPLANT
BINDER BREAST LRG (GAUZE/BANDAGES/DRESSINGS) IMPLANT
BINDER BREAST MEDIUM (GAUZE/BANDAGES/DRESSINGS) IMPLANT
BINDER BREAST XLRG (GAUZE/BANDAGES/DRESSINGS) ×1 IMPLANT
BINDER BREAST XXLRG (GAUZE/BANDAGES/DRESSINGS) IMPLANT
BLADE HEX COATED 2.75 (ELECTRODE) ×2 IMPLANT
BLADE SURG 10 STRL SS (BLADE) ×2 IMPLANT
BLADE SURG 15 STRL LF DISP TIS (BLADE) ×1 IMPLANT
BLADE SURG 15 STRL SS (BLADE) ×2
CANISTER SUC SOCK COL 7IN (MISCELLANEOUS) ×1 IMPLANT
CANISTER SUCT 1200ML W/VALVE (MISCELLANEOUS) ×2 IMPLANT
CHLORAPREP W/TINT 26ML (MISCELLANEOUS) ×2 IMPLANT
CLIP APPLIE 11 MED OPEN (CLIP) ×1 IMPLANT
CLIP APPLIE 9.375 MED OPEN (MISCELLANEOUS) IMPLANT
CLIP VESOCCLUDE SM WIDE 6/CT (CLIP) ×2 IMPLANT
COVER BACK TABLE 60X90IN (DRAPES) ×2 IMPLANT
COVER MAYO STAND STRL (DRAPES) ×2 IMPLANT
COVER PROBE W GEL 5X96 (DRAPES) ×2 IMPLANT
COVER WAND RF STERILE (DRAPES) ×1 IMPLANT
DECANTER SPIKE VIAL GLASS SM (MISCELLANEOUS) IMPLANT
DERMABOND ADVANCED (GAUZE/BANDAGES/DRESSINGS) ×1
DERMABOND ADVANCED .7 DNX12 (GAUZE/BANDAGES/DRESSINGS) IMPLANT
DEVICE DUBIN W/COMP PLATE 8390 (MISCELLANEOUS) ×2 IMPLANT
DRAIN CHANNEL 19F RND (DRAIN) ×1 IMPLANT
DRAIN HEMOVAC 1/8 X 5 (WOUND CARE) IMPLANT
DRAPE LAPAROSCOPIC ABDOMINAL (DRAPES) ×2 IMPLANT
DRAPE UTILITY XL STRL (DRAPES) ×2 IMPLANT
DRSG PAD ABDOMINAL 8X10 ST (GAUZE/BANDAGES/DRESSINGS) ×1 IMPLANT
ELECT COATED BLADE 2.86 ST (ELECTRODE) ×2 IMPLANT
ELECT REM PT RETURN 9FT ADLT (ELECTROSURGICAL) ×2
ELECTRODE REM PT RTRN 9FT ADLT (ELECTROSURGICAL) ×1 IMPLANT
EVACUATOR SILICONE 100CC (DRAIN) ×1 IMPLANT
GAUZE SPONGE 4X4 12PLY STRL (GAUZE/BANDAGES/DRESSINGS) ×2 IMPLANT
GAUZE SPONGE 4X4 12PLY STRL LF (GAUZE/BANDAGES/DRESSINGS) IMPLANT
GLOVE BIOGEL PI IND STRL 6.5 (GLOVE) IMPLANT
GLOVE BIOGEL PI INDICATOR 6.5 (GLOVE) ×1
GLOVE SURG SIGNA 7.5 PF LTX (GLOVE) ×2 IMPLANT
GLOVE SURG SS PI 6.5 STRL IVOR (GLOVE) ×1 IMPLANT
GOWN STRL REUS W/ TWL LRG LVL3 (GOWN DISPOSABLE) ×1 IMPLANT
GOWN STRL REUS W/ TWL XL LVL3 (GOWN DISPOSABLE) ×1 IMPLANT
GOWN STRL REUS W/TWL LRG LVL3 (GOWN DISPOSABLE) ×4
GOWN STRL REUS W/TWL XL LVL3 (GOWN DISPOSABLE) ×2
KIT MARKER MARGIN INK (KITS) ×2 IMPLANT
NDL HYPO 25X1 1.5 SAFETY (NEEDLE) ×1 IMPLANT
NDL SAFETY ECLIPSE 18X1.5 (NEEDLE) IMPLANT
NEEDLE HYPO 18GX1.5 SHARP (NEEDLE)
NEEDLE HYPO 25X1 1.5 SAFETY (NEEDLE) ×2 IMPLANT
NS IRRIG 1000ML POUR BTL (IV SOLUTION) ×2 IMPLANT
PACK BASIN DAY SURGERY FS (CUSTOM PROCEDURE TRAY) ×2 IMPLANT
PENCIL BUTTON HOLSTER BLD 10FT (ELECTRODE) ×2 IMPLANT
SHEET MEDIUM DRAPE 40X70 STRL (DRAPES) ×2 IMPLANT
SLEEVE SCD COMPRESS KNEE MED (MISCELLANEOUS) ×2 IMPLANT
SPONGE LAP 18X18 RF (DISPOSABLE) ×2 IMPLANT
STRIP CLOSURE SKIN 1/4X4 (GAUZE/BANDAGES/DRESSINGS) IMPLANT
SUT ETHILON 2 0 FS 18 (SUTURE) ×1 IMPLANT
SUT MNCRL AB 4-0 PS2 18 (SUTURE) ×2 IMPLANT
SUT VICRYL 3-0 CR8 SH (SUTURE) ×4 IMPLANT
SYR BULB 3OZ (MISCELLANEOUS) ×1 IMPLANT
SYR CONTROL 10ML LL (SYRINGE) ×2 IMPLANT
TOWEL GREEN STERILE FF (TOWEL DISPOSABLE) ×2 IMPLANT
TOWEL OR NON WOVEN STRL DISP B (DISPOSABLE) ×2 IMPLANT
TUBE CONNECTING 20X1/4 (TUBING) ×2 IMPLANT
YANKAUER SUCT BULB TIP NO VENT (SUCTIONS) ×2 IMPLANT

## 2018-05-18 NOTE — Interval H&P Note (Signed)
History and Physical Interval Note:  05/18/2018 11:03 AM  Brandy Thornton  has presented today for surgery, with the diagnosis of right breast cancer  The various methods of treatment have been discussed with the patient and family.   I found both seeds.  Sister and boyfriend, Bary Richard, at bedside.  After consideration of risks, benefits and other options for treatment, the patient has consented to  Procedure(s): RIGHT BREAST LUMPECTOMY WITH RADIOACTIVE SEED X2 AND RIGHT AXILLARY LYMPH NODE DISSECTION (Right) as a surgical intervention .  The patient's history has been reviewed, patient examined, no change in status, stable for surgery.  I have reviewed the patient's chart and labs.  Questions were answered to the patient's satisfaction.     Shann Medal

## 2018-05-18 NOTE — Anesthesia Procedure Notes (Signed)
Anesthesia Regional Block: Pectoralis block   Pre-Anesthetic Checklist: ,, timeout performed, Correct Patient, Correct Site, Correct Laterality, Correct Procedure, Correct Position, site marked, Risks and benefits discussed,  Surgical consent,  Pre-op evaluation,  At surgeon's request and post-op pain management  Laterality: Right  Prep: chloraprep       Needles:  Injection technique: Single-shot  Needle Type: Echogenic Needle     Needle Length: 9cm  Needle Gauge: 21     Additional Needles:   Narrative:  Start time: 05/18/2018 9:30 AM End time: 05/18/2018 9:42 AM Injection made incrementally with aspirations every 5 mL.  Performed by: Personally  Anesthesiologist: Barnet Glasgow, MD  Additional Notes: Pt tolerated procedure well

## 2018-05-18 NOTE — Op Note (Signed)
05/18/2018  12:54 PM  PATIENT:  Brandy Thornton DOB: 08-15-1955 MRN: [id number]  PREOP DIAGNOSIS:   right breast cancer x 2  POSTOP DIAGNOSIS:    Right breast cancer, 4 and 5 o'clock position (T2, N1)  PROCEDURE:   Procedure(s):  RIGHT BREAST LUMPECTOMY WITH RADIOACTIVE SEED X2 AND RIGHT AXILLARY LYMPH NODE DISSECTION  SURGEON:   [name] Overall, M.D.  ANESTHESIA:   general  Anesthesiologist: Audry Pili, MD CRNA: Blocker, Ernesta Amble, CRNA; Maryella Shivers, CRNA  General  EBL:  minimal  ml  DRAINS:  19 french blake drain in the right axilla  LOCAL MEDICATIONS USED:   Right pectoral block by anesthesia.  20 cc of 1/4% marcaine as a local.  SPECIMEN:   Right breast lumpectomy (6 color paint), medial and inferior margin, right axillary node dissection  COUNTS CORRECT:  YES  INDICATIONS FOR PROCEDURE:  Brandy Thornton is a 62 y.o. (DOB: November 26, 1955) AA female whose primary care physician is Patient, No Pcp Per and comes for right breast lumpectomy x 2 and right axillary lymph node dissection.   She presented with a large right breast cancer and positive right axillary lymph node.  She has seen Drs. Gudena and Kinard.   She has completed neoadjuvant letrozole with significant improvement in her cancer on PE and MRI.  The options for breast cancer treatment have been discussed with the patient. She elected to proceed with lumpectomy and axillary sentinel lymph node.     The indications and potential complications of surgery were explained to the patient. Potential complications include, but are not limited to, bleeding, infection, the need for further surgery, and nerve injury.     The two seeds are in the 4 and 5 o'clock position of the right breast.   I checked these seeds in the pre op area.  OPERATIVE NOTE:   The patient was taken to operating room # 8 at [facility] where she underwent a general anesthesia  supervised by Anesthesiologist: Audry Pili, MD CRNA: Blocker,  Ernesta Amble, CRNA; Maryella Shivers, CRNA. Her right breast and axilla were prepped with  ChloraPrep and sterilely draped.    A time-out and the surgical check list was reviewed.    I turned attention to the cancer which was about at the 4 and 5 o'clock position of the right breast.   I used the Neoprobe to identify the I131 seed.  I tried to excise an area around the tumor of at least 1 cm.    I excised this block of breast tissue approximately 6 cm x 7 cm in diameter.   I painted the lumpectomy specimen with the 6 color paint kit and did a specimen mammogram which confirmed the mass, clip, and the seed were all in the right position in the specimen.  The specimen was sent to pathology who called back to confirm that they have the seed and the specimen.   If anything the tumor was close to the medial/inferior edge.  I excised additional breast tissue in medial and inferior aspect of the lumpectomy.   I then started the right axillary node dissection. I made an incision in the right axilla.  I took the dissection to the axillary vein and swept all the lymph nodes/fat below this.  I went behind the pectoralis minor muscle to remove level II nodes.  I identified the long thoracic nerve of Bell and the thoracodorsal nerve and spared these during the dissection.  I  swept out the axillary contents and sent these as one specimen.  I irrigated the wound with saline.  I closed it in layers with 3-0 vicryl sutures.   I placed a 27F Blake drain and sewed it in place with a 2-0 nylon suture.   I then irrigated the wound with saline. I infiltrated approximately 20 mL of 1/4% Marcaine between the incisions. I placed 4 clips to mark right breast biopsy cavity, at 12, 3, 6, and 9 o'clock.  I then closed all the wounds in layers using 3-0 Vicryl sutures for the deep layer. At the skin, I closed the incisions with a 4-0 Monocryl suture. The incisions were then painted with Dermabond.  She had gauze place over the wounds and  placed in a breast binder.   The patient tolerated the procedure well, was transported to the recovery room in good condition. Sponge and needle count were correct at the end of the case.   Final pathology is pending.   [name] Overall, MD, [facility] Surgery Pager: [phone] Office phone:  [phone]

## 2018-05-18 NOTE — Anesthesia Procedure Notes (Signed)
Procedure Name: LMA Insertion Date/Time: 05/18/2018 11:12 AM Performed by: Maryella Shivers, CRNA Pre-anesthesia Checklist: Patient identified, Emergency Drugs available, Suction available and Patient being monitored Patient Re-evaluated:Patient Re-evaluated prior to induction Oxygen Delivery Method: Circle system utilized Preoxygenation: Pre-oxygenation with 100% oxygen Induction Type: IV induction Ventilation: Mask ventilation without difficulty LMA: LMA inserted LMA Size: 4.0 Number of attempts: 1 Airway Equipment and Method: Bite block Placement Confirmation: positive ETCO2 Tube secured with: Tape Dental Injury: Teeth and Oropharynx as per pre-operative assessment

## 2018-05-18 NOTE — Discharge Instructions (Signed)
[facility] - DISCHARGE INSTRUCTIONS TO PATIENT  Activity:  Driving - May drive in 2 to 4 days, if doing well.   Lifting - No lifting more than 15 pounds for 1 week, then no limit  Wound Care:   Leave bandage/binder for 3 days, then you may remove binder and shower        Empty drain at least twice a day and record the amount of drainage.  Bring that record to the office with you.  Diet:  As tolerated  Follow up appointment:  Call Dr. Pollie Friar office [facility]) at [phone] for an appointment in 1 week.  Medications and dosages:  Resume your home medications.  You have a prescription for:  Vicodin  Call Dr. Lucia Gaskins or his office  [phone]) if you have:  Temperature greater than 100.4,  Persistent nausea and vomiting,  Severe uncontrolled pain,  Redness, tenderness, or signs of infection (pain, swelling, redness, odor or green/yellow discharge around the site),  Difficulty breathing, headache or visual disturbances,  Any other questions or concerns you may have after discharge.  In an emergency, call 911 or go to an Emergency Department at a nearby hospital.   No Tylenol until 3:15pm!   Post Anesthesia Home Care Instructions  Activity: Get plenty of rest for the remainder of the day. A responsible individual must stay with you for 24 hours following the procedure.  For the next 24 hours, DO NOT: -Drive a car -Paediatric nurse -Drink alcoholic beverages -Take any medication unless instructed by your physician -Make any legal decisions or sign important papers.  Meals: Start with liquid foods such as gelatin or soup. Progress to regular foods as tolerated. Avoid greasy, spicy, heavy foods. If nausea and/or vomiting occur, drink only clear liquids until the nausea and/or vomiting subsides. Call your physician if vomiting continues.  Special Instructions/Symptoms: Your throat may feel dry or sore from the anesthesia or the breathing  tube placed in your throat during surgery. If this causes discomfort, gargle with warm salt water. The discomfort should disappear within 24 hours.  If you had a scopolamine patch placed behind your ear for the management of post- operative nausea and/or vomiting:  1. The medication in the patch is effective for 72 hours, after which it should be removed.  Wrap patch in a tissue and discard in the trash. Wash hands thoroughly with soap and water. 2. You may remove the patch earlier than 72 hours if you experience unpleasant side effects which may include dry mouth, dizziness or visual disturbances. 3. Avoid touching the patch. Wash your hands with soap and water after contact with the patch.     About my Jackson-Pratt Bulb Drain  What is a Jackson-Pratt bulb? A Jackson-Pratt is a soft, round device used to collect drainage. It is connected to a long, thin drainage catheter, which is held in place by one or two small stiches near your surgical incision site. When the bulb is squeezed, it forms a vacuum, forcing the drainage to empty into the bulb.  Emptying the Jackson-Pratt bulb- To empty the bulb: 1. Release the plug on the top of the bulb. 2. Pour the bulb's contents into a measuring container which your nurse will provide. 3. Record the time emptied and amount of drainage. Empty the drain(s) as often as your     doctor or nurse recommends.  Date                  Time  Amount (Drain 1)                 Amount (Drain  2)  _____________________________________________________________________  _____________________________________________________________________  _____________________________________________________________________  _____________________________________________________________________  _____________________________________________________________________  _____________________________________________________________________  _____________________________________________________________________  _____________________________________________________________________  Squeezing the Jackson-Pratt Bulb- To squeeze the bulb: 1. Make sure the plug at the top of the bulb is open. 2. Squeeze the bulb tightly in your fist. You will hear air squeezing from the bulb. 3. Replace the plug while the bulb is squeezed. 4. Use a safety pin to attach the bulb to your clothing. This will keep the catheter from     pulling at the bulb insertion site.  When to call your doctor- Call your doctor if:  Drain site becomes red, swollen or hot.  You have a fever greater than 101 degrees F.  There is oozing at the drain site.  Drain falls out (apply a guaze bandage over the drain hole and secure it with tape).  Drainage increases daily not related to activity patterns. (You will usually have more drainage when you are active than when you are resting.)  Drainage has a bad odor.

## 2018-05-18 NOTE — Progress Notes (Signed)
Assisted Dr. Houser with right, ultrasound guided, pectoralis block. Side rails up, monitors on throughout procedure. See vital signs in flow sheet. Tolerated Procedure well. 

## 2018-05-18 NOTE — Transfer of Care (Signed)
Immediate Anesthesia Transfer of Care Note  Patient: Brandy Thornton  Procedure(s) Performed: RIGHT BREAST LUMPECTOMY WITH RADIOACTIVE SEED X2 AND RIGHT AXILLARY LYMPH NODE DISSECTION (Right Breast)  Patient Location: PACU  Anesthesia Type:GA combined with regional for post-op pain  Level of Consciousness: sedated  Airway & Oxygen Therapy: Patient Spontanous Breathing and Patient connected to face mask oxygen  Post-op Assessment: Report given to RN and Post -op Vital signs reviewed and stable  Post vital signs: Reviewed and stable  Last Vitals:  Vitals Value Taken Time  BP 139/99 05/18/2018  1:08 PM  Temp    Pulse 88 05/18/2018  1:08 PM  Resp    SpO2 100 % 05/18/2018  1:08 PM  Vitals shown include unvalidated device data.  Last Pain:  Vitals:   05/18/18 0859  TempSrc: Oral  PainSc:          Complications: No apparent anesthesia complications

## 2018-05-18 NOTE — Anesthesia Postprocedure Evaluation (Signed)
Anesthesia Post Note  Patient: Brandy Thornton  Procedure(s) Performed: RIGHT BREAST LUMPECTOMY WITH RADIOACTIVE SEED X2 AND RIGHT AXILLARY LYMPH NODE DISSECTION (Right Breast)     Patient location during evaluation: PACU Anesthesia Type: General Level of consciousness: awake and alert Pain management: pain level controlled Vital Signs Assessment: post-procedure vital signs reviewed and stable Respiratory status: spontaneous breathing, nonlabored ventilation and respiratory function stable Cardiovascular status: blood pressure returned to baseline and stable Postop Assessment: no apparent nausea or vomiting Anesthetic complications: no    Last Vitals:  Vitals:   05/18/18 1400 05/18/18 1431  BP: (!) 145/86 (!) 152/92  Pulse: 82 87  Resp: 19 16  Temp:  36.8 C  SpO2: 100% 100%    Last Pain:  Vitals:   05/18/18 1431  TempSrc: Oral  PainSc: 5                  Audry Pili

## 2018-05-19 ENCOUNTER — Encounter (HOSPITAL_BASED_OUTPATIENT_CLINIC_OR_DEPARTMENT_OTHER): Payer: Self-pay | Admitting: Surgery

## 2018-05-25 ENCOUNTER — Inpatient Hospital Stay: Payer: Managed Care, Other (non HMO) | Attending: Hematology and Oncology | Admitting: Hematology and Oncology

## 2018-05-25 ENCOUNTER — Encounter: Payer: Self-pay | Admitting: *Deleted

## 2018-05-25 ENCOUNTER — Other Ambulatory Visit: Payer: Self-pay | Admitting: *Deleted

## 2018-05-25 DIAGNOSIS — C773 Secondary and unspecified malignant neoplasm of axilla and upper limb lymph nodes: Secondary | ICD-10-CM | POA: Diagnosis not present

## 2018-05-25 DIAGNOSIS — C50311 Malignant neoplasm of lower-inner quadrant of right female breast: Secondary | ICD-10-CM | POA: Insufficient documentation

## 2018-05-25 DIAGNOSIS — Z170 Estrogen receptor positive status [ER+]: Secondary | ICD-10-CM | POA: Diagnosis not present

## 2018-05-25 DIAGNOSIS — Z79899 Other long term (current) drug therapy: Secondary | ICD-10-CM | POA: Diagnosis not present

## 2018-05-25 NOTE — Progress Notes (Signed)
Patient Care Team: Carol Ada, MD as PCP - General (Family Medicine) [name] Overall, MD as Consulting Physician (General Surgery) Nicholas Lose, MD as Consulting Physician (Hematology and Oncology) Gery Pray, MD as Consulting Physician (Radiation Oncology)  DIAGNOSIS:  Encounter Diagnosis  Name Primary?  . Malignant neoplasm of lower-inner quadrant of right breast of female, estrogen receptor positive ([place])     SUMMARY OF ONCOLOGIC HISTORY:   Malignant neoplasm of lower-inner quadrant of right breast of female, estrogen receptor positive ([place])   10/20/2017 Initial Diagnosis    Palpable right breast mass LIQ: At 430: 2.8 cm, at 330 position: 1.1 cm, right axillary lymph node: Biopsy of all 3 revealed grade 2 invasive lobular cancer ER 80%, PR 70%, Ki-67 40%, HER-2 negative ratio 1.74, T2N1 stage II a AJCC 8    10/30/2017 Miscellaneous    Mammaprint: Low risk: 10-year risk of recurrence untreated 10% luminal type A    11/03/2017 - 05/11/2018 Anti-estrogen oral therapy    Neoadjuvant antiestrogen therapy with letrozole 2.5 mg daily    05/18/2018 Surgery    Right lumpectomy: ILC grade 2, 1.7 cm, LCIS, margins negative, 2/16 lymph nodes positive, ER 80%, PR 70%, HER-2 negative, Ki-67 40%, RCB class II, ypT1c, ypN1a      CHIEF COMPLIANT: Follow-up after recent right lumpectomy  INTERVAL HISTORY: Brandy Thornton is a 62 year old with above-mentioned history of right breast cancer who underwent neoadjuvant antiestrogen therapy with letrozole and underwent lumpectomy and is she is here accompanied by her mother and father to discuss the allergy report and discuss the adjuvant treatment plan.  She has a drain in place and is recovering from the recent surgery.  The drain is likely to be removed tomorrow.  Mammaprint done prior to neoadjuvant therapy was low risk and hence she did not need chemotherapy.  REVIEW OF SYSTEMS:   Constitutional: Denies fevers, chills or abnormal weight  loss Eyes: Denies blurriness of vision Ears, nose, mouth, throat, and face: Denies mucositis or sore throat Respiratory: Denies cough, dyspnea or wheezes Cardiovascular: Denies palpitation, chest discomfort Gastrointestinal:  Denies nausea, heartburn or change in bowel habits Skin: Denies abnormal skin rashes Lymphatics: Denies new lymphadenopathy or easy bruising Neurological:Denies numbness, tingling or new weaknesses Behavioral/Psych: Mood is stable, no new changes  Extremities: No lower extremity edema Breast: Recent right lumpectomy and axillary lymph node dissection All other systems were reviewed with the patient and are negative.  I have reviewed the past medical history, past surgical history, social history and family history with the patient and they are unchanged from previous note.  ALLERGIES:  has No Known Allergies.  MEDICATIONS:  Current Outpatient Medications  Medication Sig Dispense Refill  . HYDROcodone-acetaminophen (NORCO/VICODIN) 5-325 MG tablet Take 1 tablet by mouth every 6 (six) hours as needed for moderate pain. 20 tablet 0  . letrozole (FEMARA) 2.5 MG tablet Take 1 tablet (2.5 mg total) by mouth daily. 90 tablet 3  . Nutritional Supplements (LIVER DEFENSE PO) Take by mouth.    . valsartan-hydrochlorothiazide (DIOVAN-HCT) 320-12.5 MG per tablet Take 1 tablet by mouth daily.     No current facility-administered medications for this visit.     PHYSICAL EXAMINATION: ECOG PERFORMANCE STATUS: 1 - Symptomatic but completely ambulatory  Vitals:   05/25/18 1107  BP: 122/65  Pulse: 99  Resp: 17  Temp: 99.1 F (37.3 C)  SpO2: 100%   Filed Weights   05/25/18 1107  Weight: 166 lb 3.2 oz (75.4 kg)    GENERAL:alert, no  distress and comfortable SKIN: skin color, texture, turgor are normal, no rashes or significant lesions EYES: normal, Conjunctiva are pink and non-injected, sclera clear OROPHARYNX:no exudate, no erythema and lips, buccal mucosa, and tongue  normal  NECK: supple, thyroid normal size, non-tender, without nodularity LYMPH:  no palpable lymphadenopathy in the cervical, axillary or inguinal LUNGS: clear to auscultation and percussion with normal breathing effort HEART: regular rate & rhythm and no murmurs and no lower extremity edema ABDOMEN:abdomen soft, non-tender and normal bowel sounds MUSCULOSKELETAL:no cyanosis of digits and no clubbing  NEURO: alert & oriented x 3 with fluent speech, no focal motor/sensory deficits EXTREMITIES: No lower extremity edema   LABORATORY DATA:  I have reviewed the data as listed CMP Latest Ref Rng & Units 05/13/2018 04/26/2018 10/28/2017  Glucose 70 - 99 mg/dL 93 - 102  BUN 6 - 20 mg/dL 14 - 11  Creatinine 0.44 - 1.00 mg/dL 0.66 0.70 0.74  Sodium 135 - 145 mmol/L 141 - 141  Potassium 3.5 - 5.1 mmol/L 3.9 - 4.2  Chloride 98 - 111 mmol/L 107 - 104  CO2 22 - 32 mmol/L 26 - 27  Calcium 8.9 - 10.3 mg/dL 9.9 - 10.4  Total Protein 6.4 - 8.3 g/dL - - 8.1  Total Bilirubin 0.2 - 1.2 mg/dL - - 0.4  Alkaline Phos 40 - 150 U/L - - 60  AST 5 - 34 U/L - - 16  ALT 0 - 55 U/L - - 14    Lab Results  Component Value Date   WBC 6.5 10/28/2017   HGB 12.2 10/28/2017   HCT 36.4 10/28/2017   MCV 79.9 10/28/2017   PLT 448 (H) 10/28/2017   NEUTROABS 3.5 10/28/2017    ASSESSMENT & PLAN:  Malignant neoplasm of lower-inner quadrant of right breast of female, estrogen receptor positive ([place]) 10/20/2017:Palpable right breast mass LIQ: At 430: 2.8 cm, at 330 position: 1.1 cm, right axillary lymph node: Biopsy of all 3 revealed grade 2 invasive lobular cancer ER 80%, PR 70%, Ki-67 40%, HER-2 negative ratio 1.74, T2N1 stage II a AJCC 8  Mammaprint: Low risk Treatment summary:  Neoadjuvant antiestrogen therapy with letrozole 2.5 mg daily Right lumpectomy: ILC grade 2, 1.7 cm, LCIS, margins negative, 2/16 lymph nodes positive, ER 80%, PR 70%, HER-2 negative, Ki-67 40%, RCB class II, ypT1c, ypN1a   Pathology  counseling: I discussed the final pathology report of the patient provided  a copy of this report. I discussed the margins as well as lymph node surgeries. We also discussed the final staging along with previously performed ER/PR and HER-2/neu testing.  Recommendation: 1.  Adjuvant radiation therapy 2. followed by adjuvant antiestrogen therapy Patient will be eligible to participate in [facility] clinical trial.  We will provide her with information regarding this trial  Return to clinic at the end of radiation to resume antiestrogen therapy.     No orders of the defined types were placed in this encounter.  The patient has a good understanding of the overall plan. she agrees with it. she will call with any problems that may develop before the next visit here.   Harriette Ohara, MD 05/25/18

## 2018-05-25 NOTE — Assessment & Plan Note (Signed)
10/20/2017:Palpable right breast mass LIQ: At 430: 2.8 cm, at 330 position: 1.1 cm, right axillary lymph node: Biopsy of all 3 revealed grade 2 invasive lobular cancer ER 80%, PR 70%, Ki-67 40%, HER-2 negative ratio 1.74, T2N1 stage II a AJCC 8  Mammaprint: Low risk Treatment summary:  Neoadjuvant antiestrogen therapy with letrozole 2.5 mg daily Right lumpectomy: ILC grade 2, 1.7 cm, LCIS, margins negative, 2/16 lymph nodes positive, ER 80%, PR 70%, HER-2 negative, Ki-67 40%, RCB class II, ypT1c, ypN1a   Pathology counseling: I discussed the final pathology report of the patient provided  a copy of this report. I discussed the margins as well as lymph node surgeries. We also discussed the final staging along with previously performed ER/PR and HER-2/neu testing.  Recommendation: 1.  Adjuvant radiation therapy 2. followed by adjuvant antiestrogen therapy  Return to clinic at the end of radiation to resume antiestrogen therapy.

## 2018-05-27 ENCOUNTER — Encounter: Payer: Self-pay | Admitting: Radiation Oncology

## 2018-06-08 NOTE — Progress Notes (Signed)
Location of Breast Cancer: lower-inner quadrant of right breast of female  Histology per Pathology Report: 05/18/18:  1. Breast, lumpectomy, Right w/seed x2 - INVASIVE LOBULAR CARCINOMA, GRADE 2, SPANNING 1.7 CM. - LOBULAR CARCINOMA IN SITU. - INVASIVE TUMOR IS <0.1 CM OF THE ANTERIOR MARGIN FOCALLY. - BIOPSY SITE. - SEE ONCOLOGY TABLE. 2. Breast, excision, Right additional Medial Inferior Margin - FIBROCYSTIC CHANGE. - NO MALIGNANCY IDENTIFIED. 3. Lymph nodes, regional resection, Right Axillary - METASTATIC CARCINOMA IN TWO OF SIXTEEN LYMPH NODES (2/16).  Receptor Status: ER(80%), PR (70%), Her2-neu (negative), Ki-(40%)  Did patient present with symptoms (if so, please note symptoms) or was this found on screening mammography?: Patient felt a lump in the right breast in the middle of January  Past/Anticipated interventions by surgeon, if any: 05/18/18: PROCEDURE:   Procedure(s):  RIGHT BREAST LUMPECTOMY WITH RADIOACTIVE SEED X2 AND RIGHT AXILLARY LYMPH NODE DISSECTION  SURGEON:   [name] Overall, M.D.   Past/Anticipated interventions by medical oncology, if any: Chemotherapy  11/03/2017 - 05/11/2018 Anti-estrogen oral therapy     Neoadjuvant antiestrogen therapy with letrozole 2.5 mg daily      Lymphedema issues, if any: Pt denies any c/o lymphedema.  Pain issues, if any: Pt describes pain in breast as "soreness" and rated 2/10.   SAFETY ISSUES:  Prior radiation? No  Pacemaker/ICD? No  Possible current pregnancy? N/A, pt is post-surgical  Is the patient on methotrexate? No  Current Complaints / other details:  Ms. Sporer presents today for consult with Dr. Sondra Come for Radiation Oncology. Pt is unaccompanied.     Loma Sousa, RN 06/10/2018,11:22 AM

## 2018-06-10 ENCOUNTER — Ambulatory Visit
Admission: RE | Admit: 2018-06-10 | Discharge: 2018-06-10 | Disposition: A | Discharge status: Home / Self Care | Payer: Managed Care, Other (non HMO) | Source: Ambulatory Visit | Attending: Radiation Oncology | Admitting: Radiation Oncology

## 2018-06-10 ENCOUNTER — Other Ambulatory Visit: Payer: Self-pay

## 2018-06-10 ENCOUNTER — Encounter: Payer: Self-pay | Admitting: Radiation Oncology

## 2018-06-10 VITALS — BP 124/82 | HR 95 | Temp 98.6°F | Resp 18 | Ht 66.0 in | Wt 164.0 lb

## 2018-06-10 DIAGNOSIS — Z170 Estrogen receptor positive status [ER+]: Secondary | ICD-10-CM

## 2018-06-10 DIAGNOSIS — C50311 Malignant neoplasm of lower-inner quadrant of right female breast: Secondary | ICD-10-CM | POA: Insufficient documentation

## 2018-06-10 DIAGNOSIS — Z79899 Other long term (current) drug therapy: Secondary | ICD-10-CM | POA: Diagnosis not present

## 2018-06-10 NOTE — Progress Notes (Signed)
Radiation Oncology         (336) [phone] ________________________________  Name: Brandy Thornton MRN: [id number]  Date: 06/10/2018  DOB: 28-Sep-1955  Re-Evaluation Visit Note  CC: Carol Ada, MD  Nicholas Lose, MD    ICD-10-CM   1. Malignant neoplasm of lower-inner quadrant of right breast of female, estrogen receptor positive ([place]) C50.311    Z17.0     Diagnosis:   62 y.o. female with Clinical Stage IIA, cT2N1M0, Pathologic Stage ypT1cN1a, grade 2, ER positive Invasive lobular carcinoma, lobular carcinoma in situ of the right breast   Narrative:  The patient returns today for post-operative follow-up to further discuss the role of radiotherapy in the managment of her breast cancer. Since initial consultation, the patient had a bilateral breast MRI done on 11/02/2017 which showed biopsy-proven carcinoma within the inferior central right breast and three cortically thickened right axillary lymph nodes.              Mammaprint came back low-risk, indicating no need for chemotherapy. The patient was then started on neoadjuvant anti-estrogen oral therapy with Letrozole on 11/03/2017. Mammogram and ultrasound on 02/02/2018 showed two right breast masses and a right axillary lymph node consistent with the patient's biopsy-proven malignancy. All findings appeared stable to minimally decreased in size from prior studies.     Bilateral breast MRI on 04/26/2018 showed the known malignancy in the inferior central right breast. The abnormal enhancement is smaller in the interval. Additionally, the intensity of the enhancement has improved, and the enhancement is less confluent as well. Mildly prominent right axillary nodes were stable. She completed neoadjuvant anti-estrogen therapy on 05/11/2018.  She subsequently underwent right breast lumpectomy with axillary lymph node dissection on 05/18/2018. Final pathology revealed grade 2 invasive lobular carcinoma spanning 1.7 cm, ER 100%/PR negative/Her2 (1+)  negative, with lobular carcinoma in situ. Margins negative; invasive tumor was <0.1 cm of the anterior margin focally. Metastatic carcinoma was identified in 2 of 16 right axillary lymph nodes. She last saw Dr. Lucia Gaskins on 06/04/2018 and had a good report.  On review of systems, the patient reports soreness in her right breast and axillary region, rated 2/10. She is not taking any pain medication. She denies any lymphedema issues and has good ROM in her right arm. She denies any numbness or tingling in her right arm. She reports fatigue.  ALLERGIES:  has No Known Allergies.  Meds: Current Outpatient Medications  Medication Sig Dispense Refill  . Nutritional Supplements (LIVER DEFENSE PO) Take by mouth.    . valsartan-hydrochlorothiazide (DIOVAN-HCT) 320-12.5 MG per tablet Take 1 tablet by mouth daily.    Marland Kitchen HYDROcodone-acetaminophen (NORCO/VICODIN) 5-325 MG tablet Take 1 tablet by mouth every 6 (six) hours as needed for moderate pain. (Patient not taking: Reported on 06/10/2018) 20 tablet 0  . letrozole (FEMARA) 2.5 MG tablet Take 1 tablet (2.5 mg total) by mouth daily. (Patient not taking: Reported on 06/10/2018) 90 tablet 3   No current facility-administered medications for this encounter.     Physical Findings: The patient is in no acute distress. Patient is alert and oriented.  height is _0  (1.676 m) and weight is 164 lb (74.4 kg). Her oral temperature is 98.6 F (37 C). Her blood pressure is 124/82 and her pulse is 95. Her respiration is 18 and oxygen saturation is 100%.   Lungs are clear to auscultation bilaterally. Heart has regular rate and rhythm. No palpable cervical, supraclavicular, or axillary adenopathy. Abdomen soft, non-tender, normal bowel sounds.  Right Breast: Well-healing scar in the LIQ with minimal surgical glue present. Very small suture present along the inferior aspect of the lumpectomy scar. No palpable or visible signs of breast mass. No signs of infection. Patient  also has a separate scar in the right axilla. She does have some swelling in this region consistent with probable seroma.  Lab Findings: Lab Results  Component Value Date   WBC 6.5 10/28/2017   HGB 12.2 10/28/2017   HCT 36.4 10/28/2017   MCV 79.9 10/28/2017   PLT 448 (H) 10/28/2017    Radiographic Findings: Mm Breast Surgical Specimen  Result Date: 05/18/2018 CLINICAL DATA:  Status post surgical excision of 2 right breast lesions after radioactive seed localization. A single surgical specimen was sent. EXAM: SPECIMEN RADIOGRAPH OF THE RIGHT BREAST COMPARISON:  Previous exam(s). FINDINGS: Status post excision of the right breast. Both radioactive seeds and biopsy marker clips are present, completely intact, and were marked for pathology. IMPRESSION: Specimen radiograph of the right breast. Electronically Signed   By: Lajean Manes M.D.   On: 05/18/2018 11:52   Mm Rt Radioactive Seed Loc Mammo Guide  Result Date: 05/17/2018 CLINICAL DATA:  62 year old with biopsy-proven multifocal stage II invasive ductal carcinoma involving the LOWER INNER QUADRANT of the RIGHT breast (prior core needle biopsies of masses at the 3:30 o'clock and 4:30 o'clock position) for which the patient underwent neoadjuvant hormonal chemoprevention with letrozole. Patient presents for bracketed radioactive seed localization in anticipation of lumpectomy. EXAM: MAMMOGRAPHIC GUIDED RADIOACTIVE SEED LOCALIZATION OF THE RIGHT BREAST X 2 (BRACKETED) COMPARISON:  Previous exam(s). FINDINGS: Patient presents for radioactive seed localization prior to RIGHT breast lumpectomy. I met with the patient and we discussed the procedure of seed localization including benefits and alternatives. We discussed the high likelihood of a successful procedure. We discussed the risks of the procedure including infection, bleeding, tissue injury and further surgery. We discussed the low dose of radioactivity involved in the procedure. Informed, written  consent was given. The usual time-out protocol was performed immediately prior to the procedure. Using mammographic guidance, sterile technique with chlorhexidine as skin antisepsis, 1% lidocaine as local anesthetic, 2 separate I-125 radioactive seeds were used to perform bracketed localization of the multifocal invasive carcinoma involving the LOWER INNER QUADRANT of the RIGHT breast using a MEDIAL approach. The follow-up mammogram images confirm the seeds in the expected location and the images are marked for Dr. Lucia Gaskins. Of note, the seed was intentionally placed approximately 9 mm anteromedial to the ribbon shaped tissue marker clip due to the fact that the enhancing tumor on MRI extended ANTERIOR and MEDIAL to the clip. The POSTERIOR seed was placed immediately adjacent to the coil shaped tissue marker clip. Follow-up survey of the patient confirms the presence of the radioactive seed. Order number of both I-125 seeds: [id number] Total activity: 0.249 mCi Reference Date: 05/12/2018 The patient tolerated the procedure well and was released from the [facility]. She was given instructions regarding seed removal. IMPRESSION: Bracketed radioactive seed localization of biopsy-proven invasive cancer involving the LOWER INNER QUADRANT of the RIGHT breast. No apparent complications. Electronically Signed   By: Evangeline Dakin M.D.   On: 05/17/2018 16:03   Mm Rt Radio Seed Ea Add Lesion Loc Mammo  Result Date: 05/17/2018 CLINICAL DATA:  62 year old with biopsy-proven multifocal stage II invasive ductal carcinoma involving the LOWER INNER QUADRANT of the RIGHT breast (prior core needle biopsies of masses at the 3:30 o'clock and 4:30 o'clock position) for which the patient underwent  neoadjuvant hormonal chemoprevention with letrozole. Patient presents for bracketed radioactive seed localization in anticipation of lumpectomy. EXAM: MAMMOGRAPHIC GUIDED RADIOACTIVE SEED LOCALIZATION OF THE RIGHT BREAST X 2 (BRACKETED)  COMPARISON:  Previous exam(s). FINDINGS: Patient presents for radioactive seed localization prior to RIGHT breast lumpectomy. I met with the patient and we discussed the procedure of seed localization including benefits and alternatives. We discussed the high likelihood of a successful procedure. We discussed the risks of the procedure including infection, bleeding, tissue injury and further surgery. We discussed the low dose of radioactivity involved in the procedure. Informed, written consent was given. The usual time-out protocol was performed immediately prior to the procedure. Using mammographic guidance, sterile technique with chlorhexidine as skin antisepsis, 1% lidocaine as local anesthetic, 2 separate I-125 radioactive seeds were used to perform bracketed localization of the multifocal invasive carcinoma involving the LOWER INNER QUADRANT of the RIGHT breast using a MEDIAL approach. The follow-up mammogram images confirm the seeds in the expected location and the images are marked for Dr. Lucia Gaskins. Of note, the seed was intentionally placed approximately 9 mm anteromedial to the ribbon shaped tissue marker clip due to the fact that the enhancing tumor on MRI extended ANTERIOR and MEDIAL to the clip. The POSTERIOR seed was placed immediately adjacent to the coil shaped tissue marker clip. Follow-up survey of the patient confirms the presence of the radioactive seed. Order number of both I-125 seeds: [id number] Total activity: 0.249 mCi Reference Date: 05/12/2018 The patient tolerated the procedure well and was released from the [place] [facility]. She was given instructions regarding seed removal. IMPRESSION: Bracketed radioactive seed localization of biopsy-proven invasive cancer involving the LOWER INNER QUADRANT of the RIGHT breast. No apparent complications. Electronically Signed   By: Evangeline Dakin M.D.   On: 05/17/2018 16:03    Impression:  Clinical Stage IIA, cT2N1M0, Pathologic Stage ypT1cN1a, grade 2,  ER positive Invasive lobular carcinoma, lobular carcinoma in situ of the right breast   Today, I talked to the patient about the findings and work-up thus far.  We discussed the natural history of breast cancer and general treatment, highlighting the role of radiotherapy in the management.  We discussed the available radiation techniques, and focused on the details of logistics and delivery.  We reviewed the anticipated acute and late sequelae associated with radiation in this setting.  The patient was encouraged to ask questions that I answered to the best of my ability.  A patient consent form was discussed and signed.  We retained a copy for our records.  The patient would like to proceed with radiation.    Plan:  CT simulation today with treatment to begin in approximately 1 week. anticipate 5 and half weeks of radiation therapy directed at the right breast. The High axilla/supraclavicular region will receive approximately 5 weeks of radiation therapy.Patient will then proceed with a 1 week boost directed at the lumpectomy cavity.  ____________________________________  Blair Promise, PhD, MD  This document serves as a record of services personally performed by Gery Pray, MD. It was created on his behalf by Rae Lips, a trained medical scribe. The creation of this record is based on the scribe's personal observations and the provider's statements to them. This document has been checked and approved by the attending provider.

## 2018-06-11 ENCOUNTER — Telehealth: Payer: Self-pay | Admitting: Hematology and Oncology

## 2018-06-11 NOTE — Telephone Encounter (Signed)
Scheduled appt per 1/11 sch message - pt is aware of appt date and time   

## 2018-06-14 DIAGNOSIS — C50311 Malignant neoplasm of lower-inner quadrant of right female breast: Secondary | ICD-10-CM | POA: Insufficient documentation

## 2018-06-16 NOTE — Progress Notes (Signed)
  Radiation Oncology         [phone]) [phone] ________________________________  Name: Brandy Thornton MRN: [id number]  Date: 06/10/2018  DOB: 30-Apr-1956  SIMULATION AND TREATMENT PLANNING NOTE    ICD-10-CM   1. Malignant neoplasm of lower-inner quadrant of right breast of female, estrogen receptor positive ([place]) C50.311    Z17.0     DIAGNOSIS:  62 y.o. female with Clinical StageIIA, cT2N1M0, Pathologic Stage ypT1cN1a, grade 2, ER positiveInvasive lobularcarcinoma, lobular carcinoma in situ of the right breast   NARRATIVE:  The patient was brought to the [facility].  Identity was confirmed.  All relevant records and images related to the planned course of therapy were reviewed.  The patient freely provided informed written consent to proceed with treatment after reviewing the details related to the planned course of therapy. The consent form was witnessed and verified by the simulation staff.  Then, the patient was set-up in a stable reproducible  supine position for radiation therapy.  CT images were obtained.  Surface markings were placed.  The CT images were loaded into the planning software.  Then the target and avoidance structures were contoured.  Treatment planning then occurred.  The radiation prescription was entered and confirmed.  Then, I designed and supervised the construction of a total of 5 medically necessary complex treatment devices.  I have requested : 3D Simulation  I have requested a DVH of the following structures: heart, lungs, lumpectomy cavity.  I have ordered:CBC  PLAN:  The patient will receive 50.4 Gy in 28 fractions directed at the right breast area. The axillary area will receive 45 gray in 25 fractions. The lumpectomy cavity will then receive a boost of 10 gray in 5 fractions.   Optical Surface Tracking Plan:  Since intensity modulated radiotherapy (IMRT) and 3D conformal radiation treatment methods are predicated on accurate and precise  positioning for treatment, intrafraction motion monitoring is medically necessary to ensure accurate and safe treatment delivery.  The ability to quantify intrafraction motion without excessive ionizing radiation dose can only be performed with optical surface tracking. Accordingly, surface imaging offers the opportunity to obtain 3D measurements of patient position throughout IMRT and 3D treatments without excessive radiation exposure.  I am ordering optical surface tracking for this patient's upcoming course of radiotherapy. ________________________________  -----------------------------------  Blair Promise, PhD, MD

## 2018-06-17 ENCOUNTER — Ambulatory Visit
Admission: RE | Admit: 2018-06-17 | Discharge: 2018-06-17 | Disposition: A | Discharge status: Home / Self Care | Payer: Managed Care, Other (non HMO) | Source: Ambulatory Visit | Attending: Radiation Oncology | Admitting: Radiation Oncology

## 2018-06-17 DIAGNOSIS — C50311 Malignant neoplasm of lower-inner quadrant of right female breast: Secondary | ICD-10-CM | POA: Diagnosis not present

## 2018-06-17 DIAGNOSIS — Z170 Estrogen receptor positive status [ER+]: Secondary | ICD-10-CM

## 2018-06-17 NOTE — Progress Notes (Signed)
  Radiation Oncology         [phone] ________________________________  Name: ANNALEE MEYERHOFF MRN: [id number]  Date: 06/17/2018  DOB: 12-17-1955  Simulation Verification Note    ICD-10-CM   1. Malignant neoplasm of lower-inner quadrant of right breast of female, estrogen receptor positive ([place]) C50.311    Z17.0     Status: outpatient  NARRATIVE: The patient was brought to the treatment unit and placed in the planned treatment position. The clinical setup was verified. Then port films were obtained and uploaded to the radiation oncology medical record software.  The treatment beams were carefully compared against the planned radiation fields. The position location and shape of the radiation fields was reviewed. They targeted volume of tissue appears to be appropriately covered by the radiation beams. Organs at risk appear to be excluded as planned.  Based on my personal review, I approved the simulation verification. The patient's treatment will proceed as planned.  -----------------------------------  Blair Promise, PhD, MD  This document serves as a record of services personally performed by Gery Pray, MD. It was created on his behalf by Wilburn Mylar, a trained medical scribe. The creation of this record is based on the scribe's personal observations and the provider's statements to them. This document has been checked and approved by the attending provider.

## 2018-06-21 ENCOUNTER — Ambulatory Visit
Admission: RE | Admit: 2018-06-21 | Discharge: 2018-06-21 | Disposition: A | Discharge status: Home / Self Care | Payer: Managed Care, Other (non HMO) | Source: Ambulatory Visit | Attending: Radiation Oncology | Admitting: Radiation Oncology

## 2018-06-21 DIAGNOSIS — C50311 Malignant neoplasm of lower-inner quadrant of right female breast: Secondary | ICD-10-CM | POA: Diagnosis not present

## 2018-06-22 ENCOUNTER — Ambulatory Visit
Admission: RE | Admit: 2018-06-22 | Discharge: 2018-06-22 | Disposition: A | Discharge status: Home / Self Care | Payer: Managed Care, Other (non HMO) | Source: Ambulatory Visit | Attending: Radiation Oncology | Admitting: Radiation Oncology

## 2018-06-22 DIAGNOSIS — C50311 Malignant neoplasm of lower-inner quadrant of right female breast: Secondary | ICD-10-CM | POA: Diagnosis not present

## 2018-06-22 DIAGNOSIS — Z170 Estrogen receptor positive status [ER+]: Secondary | ICD-10-CM

## 2018-06-22 MED ORDER — ALRA NON-METALLIC DEODORANT (RAD-ONC): 1.0000 "application " | Freq: Once | TOPICAL | Status: DC

## 2018-06-22 MED ORDER — RADIAPLEXRX EX GEL: Freq: Two times a day (BID) | CUTANEOUS | Status: DC

## 2018-06-23 ENCOUNTER — Ambulatory Visit
Admission: RE | Admit: 2018-06-23 | Discharge: 2018-06-23 | Disposition: A | Discharge status: Home / Self Care | Payer: Managed Care, Other (non HMO) | Source: Ambulatory Visit | Attending: Radiation Oncology | Admitting: Radiation Oncology

## 2018-06-23 DIAGNOSIS — C50311 Malignant neoplasm of lower-inner quadrant of right female breast: Secondary | ICD-10-CM | POA: Diagnosis not present

## 2018-06-24 ENCOUNTER — Ambulatory Visit
Admission: RE | Admit: 2018-06-24 | Discharge: 2018-06-24 | Disposition: A | Discharge status: Home / Self Care | Payer: Managed Care, Other (non HMO) | Source: Ambulatory Visit | Attending: Radiation Oncology | Admitting: Radiation Oncology

## 2018-06-24 DIAGNOSIS — C50311 Malignant neoplasm of lower-inner quadrant of right female breast: Secondary | ICD-10-CM | POA: Diagnosis not present

## 2018-06-25 ENCOUNTER — Ambulatory Visit
Admission: RE | Admit: 2018-06-25 | Discharge: 2018-06-25 | Disposition: A | Discharge status: Home / Self Care | Payer: Managed Care, Other (non HMO) | Source: Ambulatory Visit | Attending: Radiation Oncology | Admitting: Radiation Oncology

## 2018-06-25 DIAGNOSIS — C50311 Malignant neoplasm of lower-inner quadrant of right female breast: Secondary | ICD-10-CM | POA: Diagnosis not present

## 2018-06-28 ENCOUNTER — Ambulatory Visit
Admission: RE | Admit: 2018-06-28 | Discharge: 2018-06-28 | Disposition: A | Discharge status: Home / Self Care | Payer: Managed Care, Other (non HMO) | Source: Ambulatory Visit | Attending: Radiation Oncology | Admitting: Radiation Oncology

## 2018-06-28 DIAGNOSIS — C50311 Malignant neoplasm of lower-inner quadrant of right female breast: Secondary | ICD-10-CM | POA: Diagnosis not present

## 2018-06-29 ENCOUNTER — Ambulatory Visit
Admission: RE | Admit: 2018-06-29 | Discharge: 2018-06-29 | Disposition: A | Discharge status: Home / Self Care | Payer: Managed Care, Other (non HMO) | Source: Ambulatory Visit | Attending: Radiation Oncology | Admitting: Radiation Oncology

## 2018-06-29 DIAGNOSIS — C50311 Malignant neoplasm of lower-inner quadrant of right female breast: Secondary | ICD-10-CM | POA: Diagnosis not present

## 2018-06-30 ENCOUNTER — Ambulatory Visit
Admission: RE | Admit: 2018-06-30 | Discharge: 2018-06-30 | Disposition: A | Discharge status: Home / Self Care | Payer: Managed Care, Other (non HMO) | Source: Ambulatory Visit | Attending: Radiation Oncology | Admitting: Radiation Oncology

## 2018-06-30 DIAGNOSIS — C50311 Malignant neoplasm of lower-inner quadrant of right female breast: Secondary | ICD-10-CM | POA: Diagnosis not present

## 2018-07-01 ENCOUNTER — Ambulatory Visit
Admission: RE | Admit: 2018-07-01 | Discharge: 2018-07-01 | Disposition: A | Discharge status: Home / Self Care | Payer: Managed Care, Other (non HMO) | Source: Ambulatory Visit | Attending: Radiation Oncology | Admitting: Radiation Oncology

## 2018-07-01 DIAGNOSIS — C50311 Malignant neoplasm of lower-inner quadrant of right female breast: Secondary | ICD-10-CM | POA: Diagnosis not present

## 2018-07-02 ENCOUNTER — Ambulatory Visit
Admission: RE | Admit: 2018-07-02 | Discharge: 2018-07-02 | Disposition: A | Discharge status: Home / Self Care | Payer: Managed Care, Other (non HMO) | Source: Ambulatory Visit | Attending: Radiation Oncology | Admitting: Radiation Oncology

## 2018-07-02 DIAGNOSIS — C50311 Malignant neoplasm of lower-inner quadrant of right female breast: Secondary | ICD-10-CM | POA: Diagnosis not present

## 2018-07-04 ENCOUNTER — Ambulatory Visit
Admission: RE | Admit: 2018-07-04 | Discharge: 2018-07-04 | Disposition: A | Discharge status: Home / Self Care | Payer: Managed Care, Other (non HMO) | Source: Ambulatory Visit | Attending: Radiation Oncology | Admitting: Radiation Oncology

## 2018-07-04 DIAGNOSIS — C50311 Malignant neoplasm of lower-inner quadrant of right female breast: Secondary | ICD-10-CM | POA: Diagnosis not present

## 2018-07-05 ENCOUNTER — Ambulatory Visit
Admission: RE | Admit: 2018-07-05 | Discharge: 2018-07-05 | Disposition: A | Discharge status: Home / Self Care | Payer: Managed Care, Other (non HMO) | Source: Ambulatory Visit | Attending: Radiation Oncology | Admitting: Radiation Oncology

## 2018-07-05 DIAGNOSIS — C50311 Malignant neoplasm of lower-inner quadrant of right female breast: Secondary | ICD-10-CM | POA: Diagnosis not present

## 2018-07-06 ENCOUNTER — Ambulatory Visit
Admission: RE | Admit: 2018-07-06 | Discharge: 2018-07-06 | Disposition: A | Discharge status: Home / Self Care | Payer: Managed Care, Other (non HMO) | Source: Ambulatory Visit | Attending: Radiation Oncology | Admitting: Radiation Oncology

## 2018-07-06 DIAGNOSIS — C50311 Malignant neoplasm of lower-inner quadrant of right female breast: Secondary | ICD-10-CM | POA: Diagnosis not present

## 2018-07-07 ENCOUNTER — Ambulatory Visit
Admission: RE | Admit: 2018-07-07 | Discharge: 2018-07-07 | Disposition: A | Discharge status: Home / Self Care | Payer: Managed Care, Other (non HMO) | Source: Ambulatory Visit | Attending: Radiation Oncology | Admitting: Radiation Oncology

## 2018-07-07 DIAGNOSIS — C50311 Malignant neoplasm of lower-inner quadrant of right female breast: Secondary | ICD-10-CM | POA: Diagnosis not present

## 2018-07-12 ENCOUNTER — Ambulatory Visit
Admission: RE | Admit: 2018-07-12 | Discharge: 2018-07-12 | Disposition: A | Discharge status: Home / Self Care | Payer: Managed Care, Other (non HMO) | Source: Ambulatory Visit | Attending: Radiation Oncology | Admitting: Radiation Oncology

## 2018-07-12 DIAGNOSIS — C50311 Malignant neoplasm of lower-inner quadrant of right female breast: Secondary | ICD-10-CM | POA: Diagnosis present

## 2018-07-13 ENCOUNTER — Ambulatory Visit
Admission: RE | Admit: 2018-07-13 | Discharge: 2018-07-13 | Disposition: A | Discharge status: Home / Self Care | Payer: Managed Care, Other (non HMO) | Source: Ambulatory Visit | Attending: Radiation Oncology | Admitting: Radiation Oncology

## 2018-07-13 DIAGNOSIS — C50311 Malignant neoplasm of lower-inner quadrant of right female breast: Secondary | ICD-10-CM | POA: Diagnosis not present

## 2018-07-13 DIAGNOSIS — Z170 Estrogen receptor positive status [ER+]: Secondary | ICD-10-CM

## 2018-07-13 MED ORDER — RADIAPLEXRX EX GEL
Freq: Once | CUTANEOUS | Status: AC
  Administered 2018-07-13: 17:00:00 via TOPICAL

## 2018-07-14 ENCOUNTER — Ambulatory Visit
Admission: RE | Admit: 2018-07-14 | Discharge: 2018-07-14 | Disposition: A | Discharge status: Home / Self Care | Payer: Managed Care, Other (non HMO) | Source: Ambulatory Visit | Attending: Radiation Oncology | Admitting: Radiation Oncology

## 2018-07-14 DIAGNOSIS — C50311 Malignant neoplasm of lower-inner quadrant of right female breast: Secondary | ICD-10-CM | POA: Diagnosis not present

## 2018-07-15 ENCOUNTER — Ambulatory Visit
Admission: RE | Admit: 2018-07-15 | Discharge: 2018-07-15 | Disposition: A | Discharge status: Home / Self Care | Payer: Managed Care, Other (non HMO) | Source: Ambulatory Visit | Attending: Radiation Oncology | Admitting: Radiation Oncology

## 2018-07-15 DIAGNOSIS — C50311 Malignant neoplasm of lower-inner quadrant of right female breast: Secondary | ICD-10-CM | POA: Diagnosis not present

## 2018-07-16 ENCOUNTER — Ambulatory Visit
Admission: RE | Admit: 2018-07-16 | Discharge: 2018-07-16 | Disposition: A | Discharge status: Home / Self Care | Payer: Managed Care, Other (non HMO) | Source: Ambulatory Visit | Attending: Radiation Oncology | Admitting: Radiation Oncology

## 2018-07-16 DIAGNOSIS — C50311 Malignant neoplasm of lower-inner quadrant of right female breast: Secondary | ICD-10-CM | POA: Diagnosis not present

## 2018-07-19 ENCOUNTER — Ambulatory Visit
Admission: RE | Admit: 2018-07-19 | Discharge: 2018-07-19 | Disposition: A | Discharge status: Home / Self Care | Payer: Managed Care, Other (non HMO) | Source: Ambulatory Visit | Attending: Radiation Oncology | Admitting: Radiation Oncology

## 2018-07-19 DIAGNOSIS — C50311 Malignant neoplasm of lower-inner quadrant of right female breast: Secondary | ICD-10-CM | POA: Diagnosis not present

## 2018-07-20 ENCOUNTER — Ambulatory Visit
Admission: RE | Admit: 2018-07-20 | Discharge: 2018-07-20 | Disposition: A | Discharge status: Home / Self Care | Payer: Managed Care, Other (non HMO) | Source: Ambulatory Visit | Attending: Radiation Oncology | Admitting: Radiation Oncology

## 2018-07-20 DIAGNOSIS — C50311 Malignant neoplasm of lower-inner quadrant of right female breast: Secondary | ICD-10-CM | POA: Diagnosis not present

## 2018-07-21 ENCOUNTER — Ambulatory Visit
Admission: RE | Admit: 2018-07-21 | Discharge: 2018-07-21 | Disposition: A | Discharge status: Home / Self Care | Payer: Managed Care, Other (non HMO) | Source: Ambulatory Visit | Attending: Radiation Oncology | Admitting: Radiation Oncology

## 2018-07-21 DIAGNOSIS — C50311 Malignant neoplasm of lower-inner quadrant of right female breast: Secondary | ICD-10-CM | POA: Diagnosis not present

## 2018-07-22 ENCOUNTER — Ambulatory Visit
Admission: RE | Admit: 2018-07-22 | Discharge: 2018-07-22 | Disposition: A | Discharge status: Home / Self Care | Payer: Managed Care, Other (non HMO) | Source: Ambulatory Visit | Attending: Radiation Oncology | Admitting: Radiation Oncology

## 2018-07-22 DIAGNOSIS — C50311 Malignant neoplasm of lower-inner quadrant of right female breast: Secondary | ICD-10-CM | POA: Diagnosis not present

## 2018-07-23 ENCOUNTER — Ambulatory Visit
Admission: RE | Admit: 2018-07-23 | Discharge: 2018-07-23 | Disposition: A | Discharge status: Home / Self Care | Payer: Managed Care, Other (non HMO) | Source: Ambulatory Visit | Attending: Radiation Oncology | Admitting: Radiation Oncology

## 2018-07-23 DIAGNOSIS — C50311 Malignant neoplasm of lower-inner quadrant of right female breast: Secondary | ICD-10-CM | POA: Diagnosis not present

## 2018-07-26 ENCOUNTER — Ambulatory Visit
Admission: RE | Admit: 2018-07-26 | Discharge: 2018-07-26 | Disposition: A | Discharge status: Home / Self Care | Payer: Managed Care, Other (non HMO) | Source: Ambulatory Visit | Attending: Radiation Oncology | Admitting: Radiation Oncology

## 2018-07-26 DIAGNOSIS — C50311 Malignant neoplasm of lower-inner quadrant of right female breast: Secondary | ICD-10-CM | POA: Diagnosis not present

## 2018-07-27 ENCOUNTER — Ambulatory Visit
Admission: RE | Admit: 2018-07-27 | Discharge: 2018-07-27 | Disposition: A | Discharge status: Home / Self Care | Payer: Managed Care, Other (non HMO) | Source: Ambulatory Visit | Attending: Radiation Oncology | Admitting: Radiation Oncology

## 2018-07-27 ENCOUNTER — Ambulatory Visit: Payer: Managed Care, Other (non HMO)

## 2018-07-27 ENCOUNTER — Ambulatory Visit: Payer: Managed Care, Other (non HMO) | Admitting: Radiation Oncology

## 2018-07-27 DIAGNOSIS — C50311 Malignant neoplasm of lower-inner quadrant of right female breast: Secondary | ICD-10-CM | POA: Diagnosis not present

## 2018-07-27 DIAGNOSIS — Z170 Estrogen receptor positive status [ER+]: Secondary | ICD-10-CM

## 2018-07-27 MED ORDER — SONAFINE EX EMUL
1.0000 "application " | Freq: Two times a day (BID) | CUTANEOUS | Status: DC
  Administered 2018-07-27: 1 via TOPICAL

## 2018-07-28 ENCOUNTER — Ambulatory Visit
Admission: RE | Admit: 2018-07-28 | Discharge: 2018-07-28 | Disposition: A | Discharge status: Home / Self Care | Payer: Managed Care, Other (non HMO) | Source: Ambulatory Visit | Attending: Radiation Oncology | Admitting: Radiation Oncology

## 2018-07-28 DIAGNOSIS — C50311 Malignant neoplasm of lower-inner quadrant of right female breast: Secondary | ICD-10-CM

## 2018-07-28 DIAGNOSIS — Z170 Estrogen receptor positive status [ER+]: Secondary | ICD-10-CM

## 2018-07-28 NOTE — Progress Notes (Signed)
.  Simulation verification  The patient was brought to the treatment machine and placed in the plan treatment position.  Clinical set up was verified to ensure that the target region is appropriately covered for the patient's upcoming electron boost treatment.  The targeted volume of tissue is appropriately covered by the radiation field.  Based on my personal review, I approve the simulation verification.  The patient's treatment will proceed as planned.  ------------------------------------------------  -----------------------------------  Jarmon Javid D. Dallis Czaja, PhD, MD  

## 2018-07-29 ENCOUNTER — Ambulatory Visit
Admission: RE | Admit: 2018-07-29 | Discharge: 2018-07-29 | Disposition: A | Discharge status: Home / Self Care | Payer: Managed Care, Other (non HMO) | Source: Ambulatory Visit | Attending: Radiation Oncology | Admitting: Radiation Oncology

## 2018-07-29 DIAGNOSIS — C50311 Malignant neoplasm of lower-inner quadrant of right female breast: Secondary | ICD-10-CM | POA: Diagnosis not present

## 2018-07-30 ENCOUNTER — Ambulatory Visit
Admission: RE | Admit: 2018-07-30 | Discharge: 2018-07-30 | Disposition: A | Discharge status: Home / Self Care | Payer: Managed Care, Other (non HMO) | Source: Ambulatory Visit | Attending: Radiation Oncology | Admitting: Radiation Oncology

## 2018-07-30 ENCOUNTER — Ambulatory Visit: Payer: Managed Care, Other (non HMO)

## 2018-07-30 DIAGNOSIS — C50311 Malignant neoplasm of lower-inner quadrant of right female breast: Secondary | ICD-10-CM | POA: Diagnosis not present

## 2018-08-02 ENCOUNTER — Ambulatory Visit: Payer: Managed Care, Other (non HMO)

## 2018-08-02 ENCOUNTER — Ambulatory Visit
Admission: RE | Admit: 2018-08-02 | Discharge: 2018-08-02 | Disposition: A | Discharge status: Home / Self Care | Payer: Managed Care, Other (non HMO) | Source: Ambulatory Visit | Attending: Radiation Oncology | Admitting: Radiation Oncology

## 2018-08-02 DIAGNOSIS — C50311 Malignant neoplasm of lower-inner quadrant of right female breast: Secondary | ICD-10-CM | POA: Diagnosis not present

## 2018-08-03 ENCOUNTER — Ambulatory Visit
Admission: RE | Admit: 2018-08-03 | Discharge: 2018-08-03 | Disposition: A | Discharge status: Home / Self Care | Payer: Managed Care, Other (non HMO) | Source: Ambulatory Visit | Attending: Radiation Oncology | Admitting: Radiation Oncology

## 2018-08-03 DIAGNOSIS — C50311 Malignant neoplasm of lower-inner quadrant of right female breast: Secondary | ICD-10-CM | POA: Diagnosis not present

## 2018-08-04 ENCOUNTER — Ambulatory Visit: Payer: Managed Care, Other (non HMO)

## 2018-08-05 ENCOUNTER — Ambulatory Visit: Payer: Managed Care, Other (non HMO)

## 2018-08-05 ENCOUNTER — Inpatient Hospital Stay: Payer: Managed Care, Other (non HMO) | Admitting: Hematology and Oncology

## 2018-08-05 ENCOUNTER — Ambulatory Visit
Admission: RE | Admit: 2018-08-05 | Discharge: 2018-08-05 | Disposition: A | Discharge status: Home / Self Care | Payer: Managed Care, Other (non HMO) | Source: Ambulatory Visit | Attending: Radiation Oncology | Admitting: Radiation Oncology

## 2018-08-05 DIAGNOSIS — C50311 Malignant neoplasm of lower-inner quadrant of right female breast: Secondary | ICD-10-CM | POA: Diagnosis not present

## 2018-08-05 NOTE — Assessment & Plan Note (Signed)
10/20/2017:Palpable right breast mass LIQ: At 430: 2.8 cm, at 330 position: 1.1 cm, right axillary lymph node: Biopsy of all 3 revealed grade 2 invasive lobular cancer ER 80%, PR 70%, Ki-67 40%, HER-2 negative ratio 1.74, T2N1 stage II a AJCC 8  Mammaprint: Low risk Treatment summary:  Neoadjuvant antiestrogen therapy with letrozole 2.5 mg daily Right lumpectomy: ILC grade 2, 1.7 cm, LCIS, margins negative, 2/16 lymph nodes positive, ER 80%, PR 70%, HER-2 negative, Ki-67 40%, RCB class II, ypT1c, ypN1a  Adjuvant radiation therapy started 06/21/2018 completed 08/05/2018 ---------------------------------------------------------------------------------------------------------------------------------------------------- Treatment plan: Adjuvant antiestrogen therapy with letrozole 2.5 mg daily Also recommended participation in [place] clinical trial  Letrozole counseling:We discussed the risks and benefits of anti-estrogen therapy with aromatase inhibitors. These include but not limited to insomnia, hot flashes, mood changes, vaginal dryness, bone density loss, and weight gain. We strongly believe that the benefits far outweigh the risks. Patient understands these risks and consented to starting treatment. Planned treatment duration is 7 years.  Return to clinic in 3 months for survivorship care plan visit

## 2018-08-06 ENCOUNTER — Ambulatory Visit: Payer: Managed Care, Other (non HMO)

## 2018-08-06 ENCOUNTER — Ambulatory Visit
Admission: RE | Admit: 2018-08-06 | Discharge: 2018-08-06 | Disposition: A | Discharge status: Home / Self Care | Payer: Managed Care, Other (non HMO) | Source: Ambulatory Visit | Attending: Radiation Oncology | Admitting: Radiation Oncology

## 2018-08-06 DIAGNOSIS — C50311 Malignant neoplasm of lower-inner quadrant of right female breast: Secondary | ICD-10-CM | POA: Diagnosis not present

## 2018-08-09 ENCOUNTER — Ambulatory Visit: Payer: Managed Care, Other (non HMO)

## 2018-08-09 ENCOUNTER — Telehealth: Payer: Self-pay

## 2018-08-09 NOTE — Telephone Encounter (Signed)
Returned patient's call regarding scheduling follow up with Dr. Lindi Adie.    Nurse scheduled follow up for patient.  No further needs at this time.

## 2018-08-12 NOTE — Progress Notes (Signed)
Patient Care Team: Carol Ada, MD as PCP - General (Family Medicine) [name] Overall, MD as Consulting Physician (General Surgery) Nicholas Lose, MD as Consulting Physician (Hematology and Oncology) Gery Pray, MD as Consulting Physician (Radiation Oncology)  DIAGNOSIS:    ICD-10-CM   1. Malignant neoplasm of lower-inner quadrant of right breast of female, estrogen receptor positive ([place]) C50.311    Z17.0     SUMMARY OF ONCOLOGIC HISTORY:   Malignant neoplasm of lower-inner quadrant of right breast of female, estrogen receptor positive ([place])   10/20/2017 Initial Diagnosis    Palpable right breast mass LIQ: At 430: 2.8 cm, at 330 position: 1.1 cm, right axillary lymph node: Biopsy of all 3 revealed grade 2 invasive lobular cancer ER 80%, PR 70%, Ki-67 40%, HER-2 negative ratio 1.74, T2N1 stage II a AJCC 8    10/30/2017 Miscellaneous    Mammaprint: Low risk: 10-year risk of recurrence untreated 10% luminal type A    11/03/2017 - 05/11/2018 Anti-estrogen oral therapy    Neoadjuvant antiestrogen therapy with letrozole 2.5 mg daily    05/18/2018 Surgery    Right lumpectomy: ILC grade 2, 1.7 cm, LCIS, margins negative, 2/16 lymph nodes positive, ER 80%, PR 70%, HER-2 negative, Ki-67 40%, RCB class II, ypT1c, ypN1a      06/02/2018 Cancer Staging    Staging form: Breast, AJCC 8th Edition - Pathologic: No Stage Recommended (ypT1c, pN1a, cM0, G2, ER+, PR+, HER2-) - Signed by Gardenia Phlegm, NP on 06/02/2018    06/21/2018 - 08/06/2018 Radiation Therapy        CHIEF COMPLIANT: Follow-up of letrozole therapy  INTERVAL HISTORY: Brandy Thornton is a 63 y.o. with above-mentioned history of right breast cancer. She underwent a right lumpectomy and is currently on letrozole therapy. She presents to the clinic today and is doing well. She tolerated radiation well, and reports fatigue on the third week which has resolved since she finished on 08/06/18. She reports hot flashes, which  are worse at night and wake her up and were present prior to letrozole therapy. Redness around her radiation site is subsiding. She reported pain in her right arm that has subsided with physical therapy exercises.   REVIEW OF SYSTEMS:   Constitutional: Denies fevers, chills or abnormal weight loss (+) severe hot flashes  Eyes: Denies blurriness of vision Ears, nose, mouth, throat, and face: Denies mucositis or sore throat Respiratory: Denies cough, dyspnea or wheezes Cardiovascular: Denies palpitation, chest discomfort Gastrointestinal:  Denies nausea, heartburn or change in bowel habits Skin: (+) redness at radiation site MSK: (+) right arm pain Lymphatics: Denies new lymphadenopathy or easy bruising Neurological:Denies numbness, tingling or new weaknesses Behavioral/Psych: Mood is stable, no new changes  Extremities: No lower extremity edema Breast: denies any pain or lumps or nodules in either breasts All other systems were reviewed with the patient and are negative.  I have reviewed the past medical history, past surgical history, social history and family history with the patient and they are unchanged from previous note.  ALLERGIES:  has No Known Allergies.  MEDICATIONS:  Current Outpatient Medications  Medication Sig Dispense Refill  . HYDROcodone-acetaminophen (NORCO/VICODIN) 5-325 MG tablet Take 1 tablet by mouth every 6 (six) hours as needed for moderate pain. (Patient not taking: Reported on 06/10/2018) 20 tablet 0  . letrozole (FEMARA) 2.5 MG tablet Take 1 tablet (2.5 mg total) by mouth daily. 90 tablet 3  . Nutritional Supplements (LIVER DEFENSE PO) Take by mouth.    . valsartan-hydrochlorothiazide (DIOVAN-HCT)  320-12.5 MG per tablet Take 1 tablet by mouth daily.     No current facility-administered medications for this visit.     PHYSICAL EXAMINATION: ECOG PERFORMANCE STATUS: 0 - Asymptomatic  Vitals:   08/17/18 0826  BP: (!) 152/88  Pulse: 91  Resp: 18  Temp:  98.3 F (36.8 C)  SpO2: 100%   Filed Weights   08/17/18 0826  Weight: 166 lb (75.3 kg)    GENERAL:alert, no distress and comfortable SKIN: skin color, texture, turgor are normal, no rashes or significant lesions EYES: normal, Conjunctiva are pink and non-injected, sclera clear OROPHARYNX:no exudate, no erythema and lips, buccal mucosa, and tongue normal  NECK: supple, thyroid normal size, non-tender, without nodularity LYMPH:  no palpable lymphadenopathy in the cervical, axillary or inguinal LUNGS: clear to auscultation and percussion with normal breathing effort HEART: regular rate & rhythm and no murmurs and no lower extremity edema ABDOMEN:abdomen soft, non-tender and normal bowel sounds MUSCULOSKELETAL:no cyanosis of digits and no clubbing  NEURO: alert & oriented x 3 with fluent speech, no focal motor/sensory deficits EXTREMITIES: No lower extremity edema  LABORATORY DATA:  I have reviewed the data as listed CMP Latest Ref Rng & Units 05/13/2018 04/26/2018 10/28/2017  Glucose 70 - 99 mg/dL 93 - 102  BUN 6 - 20 mg/dL 14 - 11  Creatinine 0.44 - 1.00 mg/dL 0.66 0.70 0.74  Sodium 135 - 145 mmol/L 141 - 141  Potassium 3.5 - 5.1 mmol/L 3.9 - 4.2  Chloride 98 - 111 mmol/L 107 - 104  CO2 22 - 32 mmol/L 26 - 27  Calcium 8.9 - 10.3 mg/dL 9.9 - 10.4  Total Protein 6.4 - 8.3 g/dL - - 8.1  Total Bilirubin 0.2 - 1.2 mg/dL - - 0.4  Alkaline Phos 40 - 150 U/L - - 60  AST 5 - 34 U/L - - 16  ALT 0 - 55 U/L - - 14    Lab Results  Component Value Date   WBC 6.5 10/28/2017   HGB 12.2 10/28/2017   HCT 36.4 10/28/2017   MCV 79.9 10/28/2017   PLT 448 (H) 10/28/2017   NEUTROABS 3.5 10/28/2017    ASSESSMENT & PLAN:  Malignant neoplasm of lower-inner quadrant of right breast of female, estrogen receptor positive ([place]) 10/20/2017:Palpable right breast mass LIQ: At 430: 2.8 cm, at 330 position: 1.1 cm, right axillary lymph node: Biopsy of all 3 revealed grade 2 invasive lobular cancer ER  80%, PR 70%, Ki-67 40%, HER-2 negative ratio 1.74, T2N1 stage II a AJCC 8  Mammaprint: Low risk Treatment summary: Neoadjuvant antiestrogen therapy with letrozole 2.5 mg daily Right lumpectomy: ILC grade 2, 1.7 cm, LCIS, margins negative, 2/16 lymph nodes positive, ER 80%, PR 70%, HER-2 negative, Ki-67 40%, RCB class II, ypT1c, ypN1a Adjuvant radiation therapy started 06/21/2018 completed 08/05/2018 ---------------------------------------------------------------------------------------------------------------------------------------------------- Treatment plan: Adjuvant antiestrogen therapy with letrozole 2.5 mg daily started 08/06/2018 Letrozole toxicities: 1.  Patient has had longstanding hot flashes.  These have not changed. Denies any arthralgias or myalgias.  Return to clinic in 3 months for survivorship care plan visit.  No orders of the defined types were placed in this encounter.  The patient has a good understanding of the overall plan. she agrees with it. she will call with any problems that may develop before the next visit here.  Nicholas Lose, MD 08/17/2018   I, Cloyde Reams Dorshimer, am acting as scribe for Nicholas Lose, MD.  I have reviewed the above documentation for accuracy and completeness, and I  agree with the above.

## 2018-08-16 ENCOUNTER — Encounter: Payer: Self-pay | Admitting: Radiation Oncology

## 2018-08-16 NOTE — Progress Notes (Signed)
  Radiation Oncology         [phone] ________________________________  Name: Brandy Thornton MRN: [id number]  Date: 08/16/2018  DOB: February 08, 1956  End of Treatment Note  Diagnosis:   Clinical StageIIA, cT2N1M0, Pathologic Stage ypT1cN1a, grade 2, ER positiveInvasive lobularcarcinoma, lobular carcinoma in situ of the right breast      Indication for treatment:  Curative       Radiation treatment dates:   06/21/18-08/06/18  Site/dose:   1. Right breast; 1.8 Gy in 28 fractions for a total of 50.4 Gy           2. Right axilla; 1.8 Gy in 25 fractions for a total of 45 Gy           3. Boost; 2 Gy in 5 fractions for a total of 10 Gy  Beams/energy:  1. 3D; 6X, 10X         2. 3D; 10X, 6X        3. 3D; 12E, 15E  Narrative: The patient tolerated radiation treatment relatively well.     At the beginning of treatment, pt reported pain under her right arm and mild fatigue. Towards the end of treatment, pt reported mild fatigue. Pt developed radiation related skin changes, including hyperpigmentation. Pt was prescribed Radiaplex for this. She also developed skin peeling toward the end of treatment. Overall the pt was without complaints.   Plan: The patient has completed radiation treatment. The patient will return to radiation oncology clinic for routine followup in one month. I advised them to call or return sooner if they have any questions or concerns related to their recovery or treatment.  -----------------------------------  Blair Promise, PhD, MD  This document serves as a record of services personally performed by Gery Pray, MD. It was created on his behalf by Mary-Margaret Loma Messing, a trained medical scribe. The creation of this record is based on the scribe's personal observations and the provider's statements to them. This document has been checked and approved by the attending provider.

## 2018-08-17 ENCOUNTER — Telehealth: Payer: Self-pay | Admitting: Adult Health

## 2018-08-17 ENCOUNTER — Inpatient Hospital Stay: Payer: Managed Care, Other (non HMO) | Attending: Hematology and Oncology | Admitting: Hematology and Oncology

## 2018-08-17 DIAGNOSIS — N951 Menopausal and female climacteric states: Secondary | ICD-10-CM | POA: Insufficient documentation

## 2018-08-17 DIAGNOSIS — Z79811 Long term (current) use of aromatase inhibitors: Secondary | ICD-10-CM | POA: Insufficient documentation

## 2018-08-17 DIAGNOSIS — Z170 Estrogen receptor positive status [ER+]: Secondary | ICD-10-CM | POA: Diagnosis not present

## 2018-08-17 DIAGNOSIS — C773 Secondary and unspecified malignant neoplasm of axilla and upper limb lymph nodes: Secondary | ICD-10-CM

## 2018-08-17 DIAGNOSIS — C50311 Malignant neoplasm of lower-inner quadrant of right female breast: Secondary | ICD-10-CM | POA: Diagnosis not present

## 2018-08-17 MED ORDER — LETROZOLE 2.5 MG PO TABS: 2.5000 mg | ORAL_TABLET | Freq: Every day | ORAL | 3 refills | Status: DC

## 2018-08-17 NOTE — Assessment & Plan Note (Signed)
10/20/2017:Palpable right breast mass LIQ: At 430: 2.8 cm, at 330 position: 1.1 cm, right axillary lymph node: Biopsy of all 3 revealed grade 2 invasive lobular cancer ER 80%, PR 70%, Ki-67 40%, HER-2 negative ratio 1.74, T2N1 stage II a AJCC 8  Mammaprint: Low risk Treatment summary: Neoadjuvant antiestrogen therapy with letrozole 2.5 mg daily Right lumpectomy: ILC grade 2, 1.7 cm, LCIS, margins negative, 2/16 lymph nodes positive, ER 80%, PR 70%, HER-2 negative, Ki-67 40%, RCB class II, ypT1c, ypN1a Adjuvant radiation therapy started 06/21/2018 completed 08/05/2018 ---------------------------------------------------------------------------------------------------------------------------------------------------- Treatment plan: Adjuvant antiestrogen therapy with letrozole 2.5 mg daily

## 2018-08-17 NOTE — Telephone Encounter (Signed)
Gave avs and calendar ° °

## 2018-08-19 ENCOUNTER — Encounter: Payer: Self-pay | Admitting: *Deleted

## 2018-08-31 ENCOUNTER — Telehealth: Payer: Self-pay

## 2018-08-31 NOTE — Telephone Encounter (Signed)
Pt reports new onset pain and swelling in breast after cleaning entire house "a deep cleaning from top to bottom". Encouraged pt to contact surgeon as pt most likely over exerted herself and also that radiation had been completed for almost a month. Pt verbalized understanding and agreement. Loma Sousa, RN BSN

## 2018-09-06 ENCOUNTER — Telehealth: Payer: Self-pay | Admitting: Medical Oncology

## 2018-09-06 NOTE — Telephone Encounter (Signed)
[place] study Call to patient regarding referral to study by Dr. Lindi Adie last fall. I introduced myself and reminded patient about the study and that at the time of referral, research nurse, Merceda Elks, spoke to her and provided her a consent form for review so that we could follow up with patient once she completed radiation treatment. Patient confirmed remembering the study. I asked patient, now that she was finished with radiation treatment and has started letrozole, if she had any questions about the study and patient declined any questions. Patient was also asked about her interest in participation and patient stated she was not interested in participating, patient did not provide a reason. Patient was thanked for her time and consideration of study and was encouraged to call Dr. Lindi Adie with any questions or concerns. Maxwell Marion, RN, BSN, [facility] Clinical Research 09/06/2018 4:37 PM

## 2018-11-15 ENCOUNTER — Other Ambulatory Visit: Payer: Self-pay | Admitting: Hematology and Oncology

## 2018-11-24 ENCOUNTER — Telehealth: Payer: Self-pay | Admitting: *Deleted

## 2018-11-24 NOTE — Telephone Encounter (Signed)
Received VM from pt stating she is experiencing joint pain and numbness in her hands from Letrozole.  Returned call to pt and LVM stating she could return my call and that she can stop taking her Letrozole for 2 weeks and to follow up with [place] after the two weeks to see if her symptoms have improved.

## 2018-11-25 ENCOUNTER — Telehealth: Payer: Self-pay

## 2018-11-25 NOTE — Telephone Encounter (Signed)
Nurse returned call from VM. Left VM for patient to return call.

## 2018-12-16 ENCOUNTER — Telehealth: Payer: Self-pay | Admitting: Adult Health

## 2018-12-16 NOTE — Telephone Encounter (Signed)
Spoke with patient to confirm Web[employer] mtg. For 12/20/18.  Emailed step-by-step instructions how to download and access [employer].

## 2018-12-20 ENCOUNTER — Encounter: Payer: Self-pay | Admitting: Adult Health

## 2018-12-20 ENCOUNTER — Telehealth: Payer: Self-pay

## 2018-12-20 ENCOUNTER — Inpatient Hospital Stay: Payer: Managed Care, Other (non HMO) | Attending: Adult Health | Admitting: Adult Health

## 2018-12-20 DIAGNOSIS — Z79899 Other long term (current) drug therapy: Secondary | ICD-10-CM

## 2018-12-20 DIAGNOSIS — Z803 Family history of malignant neoplasm of breast: Secondary | ICD-10-CM

## 2018-12-20 DIAGNOSIS — E2839 Other primary ovarian failure: Secondary | ICD-10-CM | POA: Diagnosis not present

## 2018-12-20 DIAGNOSIS — Z923 Personal history of irradiation: Secondary | ICD-10-CM

## 2018-12-20 DIAGNOSIS — Z170 Estrogen receptor positive status [ER+]: Secondary | ICD-10-CM | POA: Diagnosis not present

## 2018-12-20 DIAGNOSIS — Z8042 Family history of malignant neoplasm of prostate: Secondary | ICD-10-CM

## 2018-12-20 DIAGNOSIS — Z79811 Long term (current) use of aromatase inhibitors: Secondary | ICD-10-CM | POA: Diagnosis not present

## 2018-12-20 DIAGNOSIS — C50311 Malignant neoplasm of lower-inner quadrant of right female breast: Secondary | ICD-10-CM

## 2018-12-20 DIAGNOSIS — Z800 Family history of malignant neoplasm of digestive organs: Secondary | ICD-10-CM

## 2018-12-20 NOTE — Patient Instructions (Signed)

## 2018-12-20 NOTE — Progress Notes (Signed)
SURVIVORSHIP VIRTUAL VISIT:  I connected with Brandy Thornton on 12/20/18 at  2:00 PM EDT by [facility] and verified that I am speaking with the correct person using two identifiers.   I discussed the limitations, risks, security and privacy concerns of performing an evaluation and management service by telephone/WEBEX and the availability of in person appointments. I also discussed with the patient that there may be a patient responsible charge related to this service. The patient expressed understanding and agreed to proceed.   BRIEF ONCOLOGIC HISTORY:    Malignant neoplasm of lower-inner quadrant of right breast of female, estrogen receptor positive ([place])   10/20/2017 Initial Diagnosis    Palpable right breast mass LIQ: At 430: 2.8 cm, at 330 position: 1.1 cm, right axillary lymph node: Biopsy of all 3 revealed grade 2 invasive lobular cancer ER 80%, PR 70%, Ki-67 40%, HER-2 negative ratio 1.74, T2N1 stage II a AJCC 8    10/30/2017 Miscellaneous    Mammaprint: Low risk: 10-year risk of recurrence untreated 10% luminal type A    11/03/2017 - 05/11/2018 Anti-estrogen oral therapy    Neoadjuvant antiestrogen therapy with letrozole 2.5 mg daily    05/18/2018 Surgery    Right lumpectomy: ILC grade 2, 1.7 cm, LCIS, margins negative, 2/16 lymph nodes positive, ER 80%, PR 70%, HER-2 negative, Ki-67 40%, RCB class II, ypT1c, ypN1a      06/02/2018 Cancer Staging    Staging form: Breast, AJCC 8th Edition - Pathologic: No Stage Recommended (ypT1c, pN1a, cM0, G2, ER+, PR+, HER2-) - Signed by Gardenia Phlegm, NP on 06/02/2018    06/21/2018 - 08/06/2018 Radiation Therapy    1. Right breast; 1.8 Gy in 28 fractions for a total of 50.4 Gy                      2. Right axilla; 1.8 Gy in 25 fractions for a total of 45 Gy                      3. Boost; 2 Gy in 5 fractions for a total of 10 Gy     08/06/2018 -  Anti-estrogen oral therapy    Letrozole daily     INTERVAL HISTORY:  [name] to review  her survivorship care plan detailing her treatment course for breast cancer, as well as monitoring long-term side effects of that treatment, education regarding health maintenance, screening, and overall wellness and health promotion.     Overall, [name] reports feeling quite well.  She is taking Letrozole daily.  She notes that she doesn't like it.  She is experiencing arthralgias that are worsening.  This is worse in her wrists and thumbs.  She has gone online and has been taking omega 3 foods, and is taking cod liver oil prior to meals.  She is eating sardines and tomatoes.  She does have some hot flashes.  She is managing these with fans at night.    Nandi did have some cording following her surgery.  She found some exercises online and notes that they resolved.  She denies any swelling in her arm or breast  REVIEW OF SYSTEMS:  Review of Systems  Constitutional: Negative for appetite change, chills, fatigue, fever and unexpected weight change.  HENT:   Negative for hearing loss, lump/mass, sore throat and trouble swallowing.   Eyes: Negative for eye problems and icterus.  Respiratory: Negative for chest tightness, cough and shortness of breath.   Cardiovascular:  Negative for chest pain, leg swelling and palpitations.  Gastrointestinal: Negative for abdominal distention, abdominal pain, constipation, diarrhea, nausea and vomiting.  Endocrine: Negative for hot flashes.  Musculoskeletal: Negative for arthralgias.  Skin: Negative for itching and rash.  Neurological: Negative for dizziness, extremity weakness, headaches and numbness.  Hematological: Negative for adenopathy. Does not bruise/bleed easily.  Psychiatric/Behavioral: Negative for depression. The patient is not nervous/anxious.   Breast: Denies any new nodularity, masses, tenderness, nipple changes, or nipple discharge.      ONCOLOGY TREATMENT TEAM:  1. Surgeon:  Dr. Lucia Gaskins at [facility] 2. Medical Oncologist:  Dr. Lindi Adie  3. Radiation Oncologist: Dr. Lindi Adie    PAST MEDICAL/SURGICAL HISTORY:  Past Medical History:  Diagnosis Date  . Benign hypertension   . Bilateral carpal tunnel syndrome   . Family history of breast cancer   . Fatigue   . Genetic testing 11/05/2017   Breast/GYN panel (23 genes) @ [facility] - No pathogenic mutations detected  . Hot flashes    Past Surgical History:  Procedure Laterality Date  . BREAST EXCISIONAL BIOPSY Bilateral   . BREAST LUMPECTOMY WITH RADIOACTIVE SEED AND AXILLARY LYMPH NODE DISSECTION Right 05/18/2018   Procedure: RIGHT BREAST LUMPECTOMY WITH RADIOACTIVE SEED X2 AND RIGHT AXILLARY LYMPH NODE DISSECTION;  Surgeon: [name] Overall, MD;  Location: [place];  Service: General;  Laterality: Right;  . FOOT SURGERY    . KNEE SURGERY    . PARTIAL HYSTERECTOMY       ALLERGIES:  No Known Allergies   CURRENT MEDICATIONS:  Outpatient Encounter Medications as of 12/20/2018  Medication Sig Note  . HYDROcodone-acetaminophen (NORCO/VICODIN) 5-325 MG tablet Take 1 tablet by mouth every 6 (six) hours as needed for moderate pain. (Patient not taking: Reported on 06/10/2018)   . letrozole (FEMARA) 2.5 MG tablet TAKE 1 TABLET BY MOUTH EVERY DAY   . Nutritional Supplements (LIVER DEFENSE PO) Take by mouth.   . valsartan-hydrochlorothiazide (DIOVAN-HCT) 320-12.5 MG per tablet Take 1 tablet by mouth daily. 09/26/2014: Received from: [facility]:    No facility-administered encounter medications on file as of 12/20/2018.      ONCOLOGIC FAMILY HISTORY:  Family History  Problem Relation Age of Onset  . Breast cancer Sister 56       currently 52  . Prostate cancer Father 89       currently 68  . Stomach cancer Maternal Grandfather        dx 11s; deceased 78s     GENETIC COUNSELING/TESTING: See above  SOCIAL HISTORY:  Social History   Socioeconomic History  . Marital status: Single    Spouse name: Not on file  . Number of  children: Not on file  . Years of education: Not on file  . Highest education level: Not on file  Occupational History  . Not on file  Social Needs  . Financial resource strain: Not on file  . Food insecurity:    Worry: Not on file    Inability: Not on file  . Transportation needs:    Medical: Not on file    Non-medical: Not on file  Tobacco Use  . Smoking status: Never Smoker  . Smokeless tobacco: Never Used  Substance and Sexual Activity  . Alcohol use: Yes    Comment: social  . Drug use: No  . Sexual activity: Not on file  Lifestyle  . Physical activity:    Days per week: Not on file    Minutes per session:  Not on file  . Stress: Not on file  Relationships  . Social connections:    Talks on phone: Not on file    Gets together: Not on file    Attends religious service: Not on file    Active member of club or organization: Not on file    Attends meetings of clubs or organizations: Not on file    Relationship status: Not on file  . Intimate partner violence:    Fear of current or ex partner: Not on file    Emotionally abused: Not on file    Physically abused: Not on file    Forced sexual activity: Not on file  Other Topics Concern  . Not on file  Social History Narrative  . Not on file     OBSERVATIONS/OBJECTIVE:  Patient appears well and is no apparent distress.  Skin is normal without rash or lesion.  Breathing non labored.  Neuro is non focal.  Mood and behavior is normal.    LABORATORY DATA:  None for this visit.  DIAGNOSTIC IMAGING:  None for this visit.      ASSESSMENT AND PLAN:  Brandy Thornton is a pleasant 63 y.o. female with Stage IIA right breast invasive lobular carcinoma, ER+/PR+/HER2-, diagnosed in 10/2017, treated with lumpectomy, adjuvant radiation therapy, and anti-estrogen therapy with Letrozole beginning in 10/2017.  She presents to the Survivorship Clinic for our initial meeting and routine follow-up post-completion of treatment for breast  cancer.    1. Stage IIA right breast cancer:  Ms. Ormand is continuing to recover from definitive treatment for breast cancer. She will follow-up with her medical oncologist, Dr. Lindi Adie in 07/2019 with history and physical exam per surveillance protocol.  She will continue her anti-estrogen therapy with Letrozole. Thus far, she is tolerating the Letrozole well, (see #2).  She is due for mammogram in 01/2019; orders placed today.     Today, a comprehensive survivorship care plan and treatment summary was reviewed with the patient today detailing her breast cancer diagnosis, treatment course, potential late/long-term effects of treatment, appropriate follow-up care with recommendations for the future, and patient education resources.  A copy of this summary, along with a letter will be sent to the patient's primary care provider via mail/fax/In Basket message after today's visit.    2.  Hot flashes and arthralgias: She is tolerating these well and is avoiding any medications.  She is eating foods high in omega 3s which she can certainly continue.  We discussed that acupuncture may be helpful and I will mail her this information.    3. Bone health:  Given Ms. Anstine's age/history of breast cancer and her current treatment regimen including anti-estrogen therapy with Letrozole, she is at risk for bone demineralization.  She is due for DEXA, and I ordered this today to be done in 01/2019 with her next mammogram.  In the meantime, she was encouraged to increase her consumption of foods rich in calcium, as well as increase her weight-bearing activities.  She was given education on specific activities to promote bone health and this handout was placed in the mail.    4. Cancer screening:  Due to Ms. Kinley's history and her age, she should receive screening for skin cancers, colon cancer, and gynecologic cancers.  The information and recommendations are listed on the patient's comprehensive care plan/treatment  summary and were reviewed in detail with the patient.    5. Health maintenance and wellness promotion: Ms. Payment was encouraged to consume 5-7  servings of fruits and vegetables per day. We reviewed the "Nutrition Rainbow" handout, .  She was also encouraged to engage in moderate to vigorous exercise for 30 minutes per day most days of the week. We discussed the [employer] fitness program, which is designed for cancer survivors to help them become more physically fit after cancer treatments.  She was instructed to limit her alcohol consumption and continue to abstain from tobacco use.     6. Support services/counseling: It is not uncommon for this period of the patient's cancer care trajectory to be one of many emotions and stressors.  We discussed how this can be increasingly difficult during the times of quarantine and social distancing due to the COVID-19 pandemic.  She was given information regarding our available services and encouraged to contact me with any questions or for help enrolling in any of our support group/programs.    Follow up instructions:    -Return to cancer center for f/u with [facility] in 07/2019  -Mammogram due in 01/2019 -Bone Density 01/2019 -Follow up with Dr. Lucia Gaskins in 01/2019 after mammogram -She is welcome to return back to the Survivorship Clinic at any time; no additional follow-up needed at this time.  -Consider referral back to survivorship as a long-term survivor for continued surveillance  The patient was provided an opportunity to ask questions and all were answered. The patient agreed with the plan and demonstrated an understanding of the instructions.   The patient was advised to call back or seek an in-person evaluation if the symptoms worsen or if the condition fails to improve as anticipated.   I provided 30 minutes of face-to-face video visit time during this encounter, and > 50% was spent counseling as documented under my assessment & plan.  Scot Dock, NP

## 2018-12-20 NOTE — Telephone Encounter (Signed)
AVS and acupuncture information mailed to patient address on file.

## 2018-12-21 ENCOUNTER — Telehealth: Payer: Self-pay | Admitting: Hematology and Oncology

## 2018-12-21 NOTE — Telephone Encounter (Signed)
Tried to reach regarding schedule °

## 2019-01-28 ENCOUNTER — Other Ambulatory Visit: Payer: Self-pay

## 2019-01-28 ENCOUNTER — Ambulatory Visit
Admission: RE | Admit: 2019-01-28 | Discharge: 2019-01-28 | Disposition: A | Discharge status: Home / Self Care | Payer: Managed Care, Other (non HMO) | Source: Ambulatory Visit | Attending: Adult Health | Admitting: Adult Health

## 2019-01-28 DIAGNOSIS — C50311 Malignant neoplasm of lower-inner quadrant of right female breast: Secondary | ICD-10-CM

## 2019-01-28 DIAGNOSIS — Z170 Estrogen receptor positive status [ER+]: Secondary | ICD-10-CM

## 2019-01-28 HISTORY — DX: Personal history of irradiation: Z92.3

## 2019-02-10 ENCOUNTER — Other Ambulatory Visit: Payer: Self-pay | Admitting: Hematology and Oncology

## 2019-03-16 ENCOUNTER — Ambulatory Visit
Admission: RE | Admit: 2019-03-16 | Discharge: 2019-03-16 | Disposition: A | Discharge status: Home / Self Care | Payer: Managed Care, Other (non HMO) | Source: Ambulatory Visit | Attending: Adult Health | Admitting: Adult Health

## 2019-03-16 ENCOUNTER — Other Ambulatory Visit: Payer: Self-pay

## 2019-03-16 ENCOUNTER — Telehealth: Payer: Self-pay

## 2019-03-16 DIAGNOSIS — C50311 Malignant neoplasm of lower-inner quadrant of right female breast: Secondary | ICD-10-CM

## 2019-03-16 DIAGNOSIS — Z170 Estrogen receptor positive status [ER+]: Secondary | ICD-10-CM

## 2019-03-16 DIAGNOSIS — E2839 Other primary ovarian failure: Secondary | ICD-10-CM

## 2019-03-16 NOTE — Telephone Encounter (Signed)
Spoke with patient to inform of bone density results showing mild osteopenia.  Per NP recommendation, encouraged patient to take calcium and vitamin D if not already and to do weight bearing exercises.  Repeat bone density in 2 years.  Patient voiced understanding and thanks for call.

## 2019-03-16 NOTE — Telephone Encounter (Signed)
-----   Message from Gardenia Phlegm, NP sent at 03/16/2019  1:42 PM EDT ----- Bone density shows mild osteopenia, (-1.1, normal is -1.0).  I recommend calcium, vitamin d, weight bearing exercises, and repeat bone density in 2 years.   ----- Message ----- From: Interface, Rad Results In Sent: 03/16/2019   9:59 AM EDT To: Gardenia Phlegm, NP

## 2019-07-11 NOTE — Progress Notes (Signed)
Patient Care Team: Carol Ada, MD as PCP - General (Family Medicine) [name] Overall, MD as Consulting Physician (General Surgery) Nicholas Lose, MD as Consulting Physician (Hematology and Oncology) Gery Pray, MD as Consulting Physician (Radiation Oncology)  DIAGNOSIS:    ICD-10-CM   1. Malignant neoplasm of lower-inner quadrant of right breast of female, estrogen receptor positive ([place])  C50.311    Z17.0     SUMMARY OF ONCOLOGIC HISTORY: Oncology History  Malignant neoplasm of lower-inner quadrant of right breast of female, estrogen receptor positive ([place])  10/20/2017 Initial Diagnosis   Palpable right breast mass LIQ: At 430: 2.8 cm, at 330 position: 1.1 cm, right axillary lymph node: Biopsy of all 3 revealed grade 2 invasive lobular cancer ER 80%, PR 70%, Ki-67 40%, HER-2 negative ratio 1.74, T2N1 stage II a AJCC 8   10/30/2017 Miscellaneous   Mammaprint: Low risk: 10-year risk of recurrence untreated 10% luminal type A   11/03/2017 - 05/11/2018 Anti-estrogen oral therapy   Neoadjuvant antiestrogen therapy with letrozole 2.5 mg daily   05/18/2018 Surgery   Right lumpectomy: ILC grade 2, 1.7 cm, LCIS, margins negative, 2/16 lymph nodes positive, ER 80%, PR 70%, HER-2 negative, Ki-67 40%, RCB class II, ypT1c, ypN1a     06/02/2018 Cancer Staging   Staging form: Breast, AJCC 8th Edition - Pathologic: No Stage Recommended (ypT1c, pN1a, cM0, G2, ER+, PR+, HER2-) - Signed by Gardenia Phlegm, NP on 06/02/2018   06/21/2018 - 08/06/2018 Radiation Therapy   1. Right breast; 1.8 Gy in 28 fractions for a total of 50.4 Gy                      2. Right axilla; 1.8 Gy in 25 fractions for a total of 45 Gy                      3. Boost; 2 Gy in 5 fractions for a total of 10 Gy    08/06/2018 -  Anti-estrogen oral therapy   Letrozole daily     CHIEF COMPLIANT: Follow-up of right breast cancer on letrozole therapy  INTERVAL HISTORY: Brandy Thornton is a 63 y.o. with  above-mentioned history of right breast cancer treated with a right lumpectomy, radiation, and who is currently on letrozole therapy. Mammogram on 01/28/19 showed no evidence of malignancy bilaterally. Bone density san on 03/16/19 showed osteopenia with a T-score of -1.1 at the [facility] L1-L4. She presents to the clinic today for follow-up.  She is complaining of left hand thumb that is stiff and achy all the time.  This is gotten worse in the cold weather.  This is attributable to letrozole therapy.  REVIEW OF SYSTEMS:   Constitutional: Denies fevers, chills or abnormal weight loss Eyes: Denies blurriness of vision Ears, nose, mouth, throat, and face: Denies mucositis or sore throat Respiratory: Denies cough, dyspnea or wheezes Cardiovascular: Denies palpitation, chest discomfort Gastrointestinal: Denies nausea, heartburn or change in bowel habits Skin: Denies abnormal skin rashes Lymphatics: Denies new lymphadenopathy or easy bruising Neurological: Denies numbness, tingling or new weaknesses Behavioral/Psych: Mood is stable, no new changes  Extremities: Left thumb pain Breast: denies any pain or lumps or nodules in either breasts All other systems were reviewed with the patient and are negative.  I have reviewed the past medical history, past surgical history, social history and family history with the patient and they are unchanged from previous note.  ALLERGIES:  has No Known Allergies.  MEDICATIONS:  Current Outpatient Medications  Medication Sig Dispense Refill  . HYDROcodone-acetaminophen (NORCO/VICODIN) 5-325 MG tablet Take 1 tablet by mouth every 6 (six) hours as needed for moderate pain. (Patient not taking: Reported on 06/10/2018) 20 tablet 0  . letrozole (FEMARA) 2.5 MG tablet TAKE 1 TABLET BY MOUTH EVERY DAY 90 tablet 0  . Nutritional Supplements (LIVER DEFENSE PO) Take by mouth.    . valsartan-hydrochlorothiazide (DIOVAN-HCT) 320-12.5 MG per tablet Take 1 tablet by mouth daily.      No current facility-administered medications for this visit.     PHYSICAL EXAMINATION: ECOG PERFORMANCE STATUS: 1 - Symptomatic but completely ambulatory  Vitals:   07/12/19 1432  BP: (!) 143/78  Pulse: 83  Resp: 17  Temp: 98 F (36.7 C)  SpO2: 100%   Filed Weights   07/12/19 1432  Weight: 173 lb 4.8 oz (78.6 kg)    GENERAL: alert, no distress and comfortable SKIN: skin color, texture, turgor are normal, no rashes or significant lesions EYES: normal, Conjunctiva are pink and non-injected, sclera clear OROPHARYNX: no exudate, no erythema and lips, buccal mucosa, and tongue normal  NECK: supple, thyroid normal size, non-tender, without nodularity LYMPH: no palpable lymphadenopathy in the cervical, axillary or inguinal LUNGS: clear to auscultation and percussion with normal breathing effort HEART: regular rate & rhythm and no murmurs and no lower extremity edema ABDOMEN: abdomen soft, non-tender and normal bowel sounds MUSCULOSKELETAL: no cyanosis of digits and no clubbing  NEURO: alert & oriented x 3 with fluent speech, no focal motor/sensory deficits EXTREMITIES: No lower extremity edema BREAST: No palpable masses or nodules in either right or left breasts. No palpable axillary supraclavicular or infraclavicular adenopathy no breast tenderness or nipple discharge. (exam performed in the presence of a chaperone)  LABORATORY DATA:  I have reviewed the data as listed CMP Latest Ref Rng & Units 05/13/2018 04/26/2018 10/28/2017  Glucose 70 - 99 mg/dL 93 - 102  BUN 6 - 20 mg/dL 14 - 11  Creatinine 0.44 - 1.00 mg/dL 0.66 0.70 0.74  Sodium 135 - 145 mmol/L 141 - 141  Potassium 3.5 - 5.1 mmol/L 3.9 - 4.2  Chloride 98 - 111 mmol/L 107 - 104  CO2 22 - 32 mmol/L 26 - 27  Calcium 8.9 - 10.3 mg/dL 9.9 - 10.4  Total Protein 6.4 - 8.3 g/dL - - 8.1  Total Bilirubin 0.2 - 1.2 mg/dL - - 0.4  Alkaline Phos 40 - 150 U/L - - 60  AST 5 - 34 U/L - - 16  ALT 0 - 55 U/L - - 14    Lab  Results  Component Value Date   WBC 6.5 10/28/2017   HGB 12.2 10/28/2017   HCT 36.4 10/28/2017   MCV 79.9 10/28/2017   PLT 448 (H) 10/28/2017   NEUTROABS 3.5 10/28/2017    ASSESSMENT & PLAN:  Malignant neoplasm of lower-inner quadrant of right breast of female, estrogen receptor positive ([place]) 10/20/2017:Palpable right breast mass LIQ: At 430: 2.8 cm, at 330 position: 1.1 cm, right axillary lymph node: Biopsy of all 3 revealed grade 2 invasive lobular cancer ER 80%, PR 70%, Ki-67 40%, HER-2 negative ratio 1.74, T2N1 stage II a AJCC 8  Mammaprint: Low risk Treatment summary: Neoadjuvant antiestrogen therapy with letrozole 2.5 mg daily Right lumpectomy: ILC grade 2, 1.7 cm, LCIS, margins negative, 2/16 lymph nodes positive, ER 80%, PR 70%, HER-2 negative, Ki-67 40%, RCB class II, ypT1c, ypN1a Adjuvant radiation therapy started 06/21/2018 completed 08/05/2018 ---------------------------------------------------------------------------------------------------------------------------------------------------- Treatment plan:  Adjuvant antiestrogen therapy with letrozole 2.5 mg daily started 08/06/2018 switched to anastrozole 07/12/2019 Letrozole toxicities: 1.  Patient has had longstanding hot flashes.  These have not changed. 2.  Tendinitis in the left hand especially the left thumb:  I discussed with her about stopping letrozole and switching her to anastrozole therapy.  Breast cancer surveillance: 1.  01/28/2019: Mammogram: Benign breast density category C 2.  Breast exam 07/12/2019: Benign 3.  Bone density 03/16/2019: T score -1.1: Mild osteopenia: Recommended calcium and vitamin D and exercises  I will see her in 3 months with a MyChart video visit to discuss anastrozole toxicities.    No orders of the defined types were placed in this encounter.  The patient has a good understanding of the overall plan. she agrees with it. she will call with any problems that may develop before the  next visit here.  Nicholas Lose, MD 07/12/2019  Julious Oka Dorshimer, am acting as scribe for Dr. Nicholas Lose.  I have reviewed the above documentation for accuracy and completeness, and I agree with the above.

## 2019-07-12 ENCOUNTER — Other Ambulatory Visit: Payer: Self-pay

## 2019-07-12 ENCOUNTER — Inpatient Hospital Stay: Payer: Managed Care, Other (non HMO) | Attending: Hematology and Oncology | Admitting: Hematology and Oncology

## 2019-07-12 DIAGNOSIS — Z79899 Other long term (current) drug therapy: Secondary | ICD-10-CM | POA: Diagnosis not present

## 2019-07-12 DIAGNOSIS — C50311 Malignant neoplasm of lower-inner quadrant of right female breast: Secondary | ICD-10-CM | POA: Diagnosis present

## 2019-07-12 DIAGNOSIS — Z923 Personal history of irradiation: Secondary | ICD-10-CM | POA: Diagnosis not present

## 2019-07-12 DIAGNOSIS — Z79811 Long term (current) use of aromatase inhibitors: Secondary | ICD-10-CM | POA: Insufficient documentation

## 2019-07-12 DIAGNOSIS — Z170 Estrogen receptor positive status [ER+]: Secondary | ICD-10-CM

## 2019-07-12 DIAGNOSIS — M8580 Other specified disorders of bone density and structure, unspecified site: Secondary | ICD-10-CM | POA: Diagnosis not present

## 2019-07-12 MED ORDER — ANASTROZOLE 1 MG PO TABS: 1.0000 mg | ORAL_TABLET | Freq: Every day | ORAL | 3 refills | Status: DC

## 2019-07-12 NOTE — Assessment & Plan Note (Signed)
10/20/2017:Palpable right breast mass LIQ: At 430: 2.8 cm, at 330 position: 1.1 cm, right axillary lymph node: Biopsy of all 3 revealed grade 2 invasive lobular cancer ER 80%, PR 70%, Ki-67 40%, HER-2 negative ratio 1.74, T2N1 stage II a AJCC 8  Mammaprint: Low risk Treatment summary: Neoadjuvant antiestrogen therapy with letrozole 2.5 mg daily Right lumpectomy: ILC grade 2, 1.7 cm, LCIS, margins negative, 2/16 lymph nodes positive, ER 80%, PR 70%, HER-2 negative, Ki-67 40%, RCB class II, ypT1c, ypN1a Adjuvant radiation therapy started 06/21/2018 completed 08/05/2018 ---------------------------------------------------------------------------------------------------------------------------------------------------- Treatment plan: Adjuvant antiestrogen therapy with letrozole 2.5 mg daily started 08/06/2018 Letrozole toxicities: 1.  Patient has had longstanding hot flashes.  These have not changed. Denies any arthralgias or myalgias.  Breast cancer surveillance: 1.  01/28/2019: Mammogram: Benign breast density category C 2.  Breast exam 07/12/2019: Benign 3.  Bone density 03/16/2019: T score -1.1: Mild osteopenia: Recommended calcium and vitamin D and exercises  Return to clinic in 1 year for follow-up Return to clinic in 3 months for survivorship care plan visit.

## 2019-07-13 ENCOUNTER — Telehealth: Payer: Self-pay | Admitting: Hematology and Oncology

## 2019-07-13 NOTE — Telephone Encounter (Signed)
I left a message regarding video visit  °

## 2019-08-16 ENCOUNTER — Other Ambulatory Visit: Payer: Self-pay | Admitting: Hematology and Oncology

## 2019-10-09 NOTE — Progress Notes (Signed)
HEMATOLOGY-ONCOLOGY MYCHART VIDEO VISIT PROGRESS NOTE  I connected with Brandy Thornton on 10/10/2019 at  2:15 PM EST by MyChart video conference and verified that I am speaking with the correct person using two identifiers.  I discussed the limitations, risks, security and privacy concerns of performing an evaluation and management service by MyChart and the availability of in person appointments.  I also discussed with the patient that there may be a patient responsible charge related to this service. The patient expressed understanding and agreed to proceed.  Patient's Location: Home Physician Location: Clinic  CHIEF COMPLIANT: Follow-upof right breast cancer on anastrozole therapy  INTERVAL HISTORY: Brandy Thornton is a 64 y.o. female with above-mentioned history of right breast cancer treated with a right lumpectomy, radiation, and who is currently on anastrozole therapy. She presents over MyChart today to discuss anastrozole toxicities.  She took anastrozole for a month and discontinued it because of diffuse aches and pains in her fingers.  She is here today to discuss options.  She is also concerned about the indentation of the lumpectomy area of the breast.  She is interested in seeing a Psychiatric nurse.  Oncology History  Malignant neoplasm of lower-inner quadrant of right breast of female, estrogen receptor positive ([place])  10/20/2017 Initial Diagnosis   Palpable right breast mass LIQ: At 430: 2.8 cm, at 330 position: 1.1 cm, right axillary lymph node: Biopsy of all 3 revealed grade 2 invasive lobular cancer ER 80%, PR 70%, Ki-67 40%, HER-2 negative ratio 1.74, T2N1 stage II a AJCC 8   10/30/2017 Miscellaneous   Mammaprint: Low risk: 10-year risk of recurrence untreated 10% luminal type A   11/03/2017 - 05/11/2018 Anti-estrogen oral therapy   Neoadjuvant antiestrogen therapy with letrozole 2.5 mg daily   05/18/2018 Surgery   Right lumpectomy: ILC grade 2, 1.7 cm, LCIS, margins negative,  2/16 lymph nodes positive, ER 80%, PR 70%, HER-2 negative, Ki-67 40%, RCB class II, ypT1c, ypN1a     06/02/2018 Cancer Staging   Staging form: Breast, AJCC 8th Edition - Pathologic: No Stage Recommended (ypT1c, pN1a, cM0, G2, ER+, PR+, HER2-) - Signed by Gardenia Phlegm, NP on 06/02/2018   06/21/2018 - 08/06/2018 Radiation Therapy   1. Right breast; 1.8 Gy in 28 fractions for a total of 50.4 Gy                      2. Right axilla; 1.8 Gy in 25 fractions for a total of 45 Gy                      3. Boost; 2 Gy in 5 fractions for a total of 10 Gy    08/06/2018 -  Anti-estrogen oral therapy   Letrozole daily    Observations/Objective:  There were no vitals filed for this visit. There is no height or weight on file to calculate BMI.  I have reviewed the data as listed CMP Latest Ref Rng & Units 05/13/2018 04/26/2018 10/28/2017  Glucose 70 - 99 mg/dL 93 - 102  BUN 6 - 20 mg/dL 14 - 11  Creatinine 0.44 - 1.00 mg/dL 0.66 0.70 0.74  Sodium 135 - 145 mmol/L 141 - 141  Potassium 3.5 - 5.1 mmol/L 3.9 - 4.2  Chloride 98 - 111 mmol/L 107 - 104  CO2 22 - 32 mmol/L 26 - 27  Calcium 8.9 - 10.3 mg/dL 9.9 - 10.4  Total Protein 6.4 - 8.3 g/dL - - 8.1  Total Bilirubin 0.2 - 1.2 mg/dL - - 0.4  Alkaline Phos 40 - 150 U/L - - 60  AST 5 - 34 U/L - - 16  ALT 0 - 55 U/L - - 14    Lab Results  Component Value Date   WBC 6.5 10/28/2017   HGB 12.2 10/28/2017   HCT 36.4 10/28/2017   MCV 79.9 10/28/2017   PLT 448 (H) 10/28/2017   NEUTROABS 3.5 10/28/2017      Assessment Plan:  Malignant neoplasm of lower-inner quadrant of right breast of female, estrogen receptor positive ([place]) 10/20/2017:Palpable right breast mass LIQ: At 430: 2.8 cm, at 330 position: 1.1 cm, right axillary lymph node: Biopsy of all 3 revealed grade 2 invasive lobular cancer ER 80%, PR 70%, Ki-67 40%, HER-2 negative ratio 1.74, T2N1 stage II a AJCC 8  Mammaprint: Low risk Treatment summary: Neoadjuvant antiestrogen  therapy with letrozole 2.5 mg daily Right lumpectomy: ILC grade 2, 1.7 cm, LCIS, margins negative, 2/16 lymph nodes positive, ER 80%, PR 70%, HER-2 negative, Ki-67 40%, RCB class II, ypT1c, ypN1a Adjuvant radiation therapy started 06/21/2018 completed 08/05/2018 ---------------------------------------------------------------------------------------------------------------------------------------------------- Treatment plan: Adjuvant antiestrogen therapy with letrozole 2.5 mg dailystarted 08/06/2018 switched to anastrozole 07/12/2019 Anastrozole toxicities: She could not tolerate anastrozole.  She discontinued it. I recommended tamoxifen therapy.  She will start 10 mg daily and will increase to 20 mg daily.  I discussed the pros and cons of tamoxifen.   Breast cancer surveillance: 1.  01/28/2019: Mammogram: Benign breast density category C 2.  Breast exam 07/12/2019: Benign 3.  Bone density 03/16/2019: T score -1.1: Mild osteopenia: Recommended calcium and vitamin D and exercises  39-monthfollow-up for MyChart virtual video visit to assess tolerability to tamoxifen. I will consult plastic surgery because the patient has an indentation of the breast and she wants to see if it can be improved.  I discussed the assessment and treatment plan with the patient. The patient was provided an opportunity to ask questions and all were answered. The patient agreed with the plan and demonstrated an understanding of the instructions. The patient was advised to call back or seek an in-person evaluation if the symptoms worsen or if the condition fails to improve as anticipated.   I provided 15 minutes of face-to-face MyChart video visit time during this encounter.    VRulon Eisenmenger MD 10/10/2019   I, Molly Dorshimer, am acting as scribe for VNicholas Lose MD.  I have reviewed the above documentation for accuracy and completeness, and I agree with the above.

## 2019-10-10 ENCOUNTER — Telehealth (HOSPITAL_BASED_OUTPATIENT_CLINIC_OR_DEPARTMENT_OTHER): Payer: Managed Care, Other (non HMO) | Admitting: Hematology and Oncology

## 2019-10-10 DIAGNOSIS — Z170 Estrogen receptor positive status [ER+]: Secondary | ICD-10-CM | POA: Diagnosis not present

## 2019-10-10 DIAGNOSIS — C50311 Malignant neoplasm of lower-inner quadrant of right female breast: Secondary | ICD-10-CM | POA: Diagnosis not present

## 2019-10-10 MED ORDER — TAMOXIFEN CITRATE 20 MG PO TABS: 20.0000 mg | ORAL_TABLET | Freq: Every day | ORAL | 3 refills | Status: DC

## 2019-10-10 NOTE — Assessment & Plan Note (Signed)
10/20/2017:Palpable right breast mass LIQ: At 430: 2.8 cm, at 330 position: 1.1 cm, right axillary lymph node: Biopsy of all 3 revealed grade 2 invasive lobular cancer ER 80%, PR 70%, Ki-67 40%, HER-2 negative ratio 1.74, T2N1 stage II a AJCC 8  Mammaprint: Low risk Treatment summary: Neoadjuvant antiestrogen therapy with letrozole 2.5 mg daily Right lumpectomy: ILC grade 2, 1.7 cm, LCIS, margins negative, 2/16 lymph nodes positive, ER 80%, PR 70%, HER-2 negative, Ki-67 40%, RCB class II, ypT1c, ypN1a Adjuvant radiation therapy started 06/21/2018 completed 08/05/2018 ---------------------------------------------------------------------------------------------------------------------------------------------------- Treatment plan: Adjuvant antiestrogen therapy with letrozole 2.5 mg dailystarted 08/06/2018 switched to anastrozole 07/12/2019 Anastrozole toxicities:   Breast cancer surveillance: 1.  01/28/2019: Mammogram: Benign breast density category C 2.  Breast exam 07/12/2019: Benign 3.  Bone density 03/16/2019: T score -1.1: Mild osteopenia: Recommended calcium and vitamin D and exercises  I will see her in 1 year for follow-up

## 2019-10-11 ENCOUNTER — Telehealth: Payer: Self-pay | Admitting: Hematology and Oncology

## 2019-10-11 NOTE — Telephone Encounter (Signed)
I talk with patient regarding video visit °

## 2019-10-11 NOTE — Telephone Encounter (Signed)
PT APPT WITH DR [facility] IS 10/13/19 @ 3:30. PT IS AWARE

## 2019-10-13 DIAGNOSIS — Z923 Personal history of irradiation: Secondary | ICD-10-CM | POA: Insufficient documentation

## 2019-10-24 ENCOUNTER — Ambulatory Visit: Payer: Managed Care, Other (non HMO) | Attending: Internal Medicine

## 2019-10-24 DIAGNOSIS — Z23 Encounter for immunization: Secondary | ICD-10-CM

## 2019-10-24 NOTE — Progress Notes (Signed)
   Covid-19 Vaccination Clinic  Name:  Brandy Thornton    MRN: [id number] DOB: [birth year]  10/24/2019  [name] was observed post Covid-19 immunization for 15 minutes without incident. She was provided with Vaccine Information Sheet and instruction to access the V-Safe system.   [name] was instructed to call 911 with any severe reactions post vaccine: Marland Kitchen Difficulty breathing  . Swelling of face and throat  . A fast heartbeat  . A bad rash all over body  . Dizziness and weakness   Immunizations Administered    Name Date Dose VIS Date Route   Pfizer COVID-19 Vaccine 10/24/2019  4:32 PM 0.3 mL 07/22/2019 Intramuscular   Manufacturer: [place]   Lot: [id number]   [place]: [id number]

## 2019-11-16 ENCOUNTER — Ambulatory Visit: Payer: Managed Care, Other (non HMO) | Attending: Internal Medicine

## 2019-11-16 DIAGNOSIS — Z23 Encounter for immunization: Secondary | ICD-10-CM

## 2019-11-16 NOTE — Progress Notes (Signed)
   Covid-19 Vaccination Clinic  Name:  Brandy Thornton    MRN: [id number] DOB: 1956-05-15  11/16/2019  [name] was observed post Covid-19 immunization for 15 minutes without incident. She was provided with Vaccine Information Sheet and instruction to access the V-Safe system.   [name] was instructed to call 911 with any severe reactions post vaccine: Marland Kitchen Difficulty breathing  . Swelling of face and throat  . A fast heartbeat  . A bad rash all over body  . Dizziness and weakness   Immunizations Administered    Name Date Dose VIS Date Route   Pfizer COVID-19 Vaccine 11/16/2019  3:31 PM 0.3 mL 07/22/2019 Intramuscular   Manufacturer: [employer], [employer]   Lot: [id number]   [place]: [id number]

## 2019-11-23 NOTE — H&P (Signed)
Subjective:    Patient ID: Brandy Thornton is a 64 y.o. female.  HPI   Here for follow up discussion prior to planned bilateral mastopexies.Underwent neoadjuvant tamoxifen followed by right lumpectomy ALND 10.2019 for treatment breast ca, final pathology ILC, 1.7 cm, LCIS, margins negative, 2/16 LN +, ER/PR+, Her2-. Mammaprint low risk. Completed adjuvant radiation 07/2018. She took anastrozole for a month and discontinued it because of diffuse aches and pains in her fingers. Restarted tamoxifen.  Bothered by depression in area of lumpectomy, desires return to rounded shape.  Prior to lumpectomy 36C. Notes right breast was smaller volume than left prior to lumpectomy and remains so. Current using same bras, not using insert. Denies lymphedema.  MMG  01/2019  Wt stable.  Works in [employer] for [employer]. Can work from home if needed. Lives alone, has sister and friend in area to assist with post op care.  Review of Systems     Objective:  Physical Exam  Cardiovascular: Normal rate, regular rhythm and normal heart sounds.  Pulmonary/Chest: Effort normal.  Lymphadenopathy:    She has no axillary adenopathy.  Skin:  Fitzpatrick 5    Right<left volume Left grade 2/3 ptosis, right NAC displaced inferiorly with depression LIQ, curvilinear scar inferior and medial to NAC No masses asymmetry NAC diameter, right 35 mm Left 40 mm Right axillary scar flat faded SN to nipple R 26.5 L 28.5 cm BW R 20 L 20 cm Nipple to IMF R 5 L 9.5  cm    Assessment:    History right breast cancer s/p lumpectomy, ALND History therapeutic radiation Asymmetry acquired breasts    Plan:    Plan bilateral mastopexy.   Reviewed the depression and NAC displacement is especially over lower poles difficult to correct post RT as the fibrosis will limit expansion depressed area. Reviewed anchor type scar, possible drains, OP surgery. This would allow for NAC to move up onto surface of breast, could  attempt to move adjacent breast tissue into area of defect/depression. She is satisfied with right breast volume, and discussed left breast mastopexy with removal some breast tissue to offer more symmetry. Reviewed risks changes with aging, radiated breast will not age or develop ptosis same as not radiated breast and recurrent asymmetries will develop. Reviewed increased risks surgery in radiated field including wound healing problems, fat necrosis.  Additional risks including but not limited to bleeding, hematoma, seroma, nipple areola necrosis, asymmetry, infection, blood clots in legs or lungs, damage to adjacent structures reviewed.  Patient has been immunized for COVID. Reviewed risk COVID infectionthrough this elective surgery. Patient will receive COVID testing prior to surgery. Discussed even if patient receivesa negative test result, the tests in some cases may fail to detect the virus or patient maycontract COVID after the test.COVID 19 infectionbefore/during/aftersurgery may result in lead to a higher chance of complication and death.  Hold tamoxifen for week prior and week following surgery. Rx for tramadol given- notes hydrocodone has side effects only able to tolerate for 1-2 days.

## 2019-11-24 ENCOUNTER — Other Ambulatory Visit: Payer: Self-pay

## 2019-11-24 ENCOUNTER — Encounter (HOSPITAL_BASED_OUTPATIENT_CLINIC_OR_DEPARTMENT_OTHER): Payer: Self-pay | Admitting: Plastic Surgery

## 2019-11-29 ENCOUNTER — Encounter (HOSPITAL_COMMUNITY)
Admission: RE | Admit: 2019-11-29 | Discharge: 2019-11-29 | Disposition: A | Discharge status: Home / Self Care | Payer: Managed Care, Other (non HMO) | Source: Ambulatory Visit | Attending: Plastic Surgery | Admitting: Plastic Surgery

## 2019-11-29 ENCOUNTER — Other Ambulatory Visit: Payer: Self-pay

## 2019-11-29 ENCOUNTER — Encounter (HOSPITAL_BASED_OUTPATIENT_CLINIC_OR_DEPARTMENT_OTHER)
Admission: RE | Admit: 2019-11-29 | Discharge: 2019-11-29 | Disposition: A | Discharge status: Home / Self Care | Payer: Managed Care, Other (non HMO) | Source: Ambulatory Visit | Attending: Plastic Surgery | Admitting: Plastic Surgery

## 2019-11-29 DIAGNOSIS — Z20822 Contact with and (suspected) exposure to covid-19: Secondary | ICD-10-CM | POA: Insufficient documentation

## 2019-11-29 DIAGNOSIS — I10 Essential (primary) hypertension: Secondary | ICD-10-CM | POA: Diagnosis not present

## 2019-11-29 DIAGNOSIS — N6031 Fibrosclerosis of right breast: Secondary | ICD-10-CM | POA: Diagnosis not present

## 2019-11-29 DIAGNOSIS — N6022 Fibroadenosis of left breast: Secondary | ICD-10-CM | POA: Diagnosis not present

## 2019-11-29 DIAGNOSIS — Z01812 Encounter for preprocedural laboratory examination: Secondary | ICD-10-CM | POA: Insufficient documentation

## 2019-11-29 DIAGNOSIS — C50911 Malignant neoplasm of unspecified site of right female breast: Secondary | ICD-10-CM | POA: Diagnosis not present

## 2019-11-29 DIAGNOSIS — Z170 Estrogen receptor positive status [ER+]: Secondary | ICD-10-CM | POA: Diagnosis not present

## 2019-11-29 DIAGNOSIS — N6489 Other specified disorders of breast: Secondary | ICD-10-CM | POA: Diagnosis present

## 2019-11-29 DIAGNOSIS — Z421 Encounter for breast reconstruction following mastectomy: Secondary | ICD-10-CM | POA: Diagnosis not present

## 2019-11-29 DIAGNOSIS — Z79810 Long term (current) use of selective estrogen receptor modulators (SERMs): Secondary | ICD-10-CM | POA: Diagnosis not present

## 2019-11-29 DIAGNOSIS — G709 Myoneural disorder, unspecified: Secondary | ICD-10-CM | POA: Diagnosis not present

## 2019-11-29 DIAGNOSIS — M1990 Unspecified osteoarthritis, unspecified site: Secondary | ICD-10-CM | POA: Diagnosis not present

## 2019-11-29 LAB — BASIC METABOLIC PANEL
Anion gap: 10 (ref 5–15)
BUN: 15 mg/dL (ref 8–23)
CO2: 25 mmol/L (ref 22–32)
Calcium: 9.2 mg/dL (ref 8.9–10.3)
Chloride: 107 mmol/L (ref 98–111)
Creatinine, Ser: 0.92 mg/dL (ref 0.44–1.00)
GFR calc Af Amer: >60 mL/min (ref >60)
GFR calc non Af Amer: >60 mL/min (ref >60)
Glucose, Bld: 118 mg/dL — ABNORMAL HIGH (ref 70–99)
Potassium: 4.5 mmol/L (ref 3.5–5.1)
Sodium: 142 mmol/L (ref 135–145)

## 2019-11-29 MED ORDER — CHLORHEXIDINE GLUCONATE CLOTH 2 % EX PADS: 6.0000 | MEDICATED_PAD | Freq: Once | CUTANEOUS | Status: DC

## 2019-11-29 NOTE — Progress Notes (Signed)

## 2019-11-30 LAB — SARS CORONAVIRUS 2 (TAT 6-24 HRS): SARS Coronavirus 2: NEGATIVE

## 2019-12-02 ENCOUNTER — Ambulatory Visit (HOSPITAL_BASED_OUTPATIENT_CLINIC_OR_DEPARTMENT_OTHER): Payer: Managed Care, Other (non HMO) | Admitting: Anesthesiology

## 2019-12-02 ENCOUNTER — Ambulatory Visit (HOSPITAL_BASED_OUTPATIENT_CLINIC_OR_DEPARTMENT_OTHER)
Admission: RE | Admit: 2019-12-02 | Discharge: 2019-12-02 | Disposition: A | Discharge status: Home / Self Care | Payer: Managed Care, Other (non HMO) | Attending: Plastic Surgery | Admitting: Plastic Surgery

## 2019-12-02 ENCOUNTER — Encounter (HOSPITAL_BASED_OUTPATIENT_CLINIC_OR_DEPARTMENT_OTHER): Payer: Self-pay | Admitting: Plastic Surgery

## 2019-12-02 ENCOUNTER — Other Ambulatory Visit: Payer: Self-pay

## 2019-12-02 ENCOUNTER — Encounter (HOSPITAL_BASED_OUTPATIENT_CLINIC_OR_DEPARTMENT_OTHER)
Admission: RE | Disposition: A | Discharge status: Home / Self Care | Payer: Self-pay | Source: Home / Self Care | Attending: Plastic Surgery

## 2019-12-02 DIAGNOSIS — Z170 Estrogen receptor positive status [ER+]: Secondary | ICD-10-CM | POA: Insufficient documentation

## 2019-12-02 DIAGNOSIS — Z421 Encounter for breast reconstruction following mastectomy: Secondary | ICD-10-CM | POA: Diagnosis not present

## 2019-12-02 DIAGNOSIS — N6031 Fibrosclerosis of right breast: Secondary | ICD-10-CM | POA: Insufficient documentation

## 2019-12-02 DIAGNOSIS — C50911 Malignant neoplasm of unspecified site of right female breast: Secondary | ICD-10-CM | POA: Insufficient documentation

## 2019-12-02 DIAGNOSIS — N6022 Fibroadenosis of left breast: Secondary | ICD-10-CM | POA: Insufficient documentation

## 2019-12-02 DIAGNOSIS — I10 Essential (primary) hypertension: Secondary | ICD-10-CM | POA: Insufficient documentation

## 2019-12-02 DIAGNOSIS — Z79810 Long term (current) use of selective estrogen receptor modulators (SERMs): Secondary | ICD-10-CM | POA: Insufficient documentation

## 2019-12-02 DIAGNOSIS — N6489 Other specified disorders of breast: Secondary | ICD-10-CM | POA: Insufficient documentation

## 2019-12-02 DIAGNOSIS — G709 Myoneural disorder, unspecified: Secondary | ICD-10-CM | POA: Insufficient documentation

## 2019-12-02 DIAGNOSIS — M1990 Unspecified osteoarthritis, unspecified site: Secondary | ICD-10-CM | POA: Insufficient documentation

## 2019-12-02 HISTORY — DX: Cardiac murmur, unspecified: R01.1

## 2019-12-02 HISTORY — PX: MASTOPEXY: SHX5358

## 2019-12-02 SURGERY — MASTOPEXY
Anesthesia: General | Site: Breast | Laterality: Bilateral

## 2019-12-02 MED ORDER — DEXAMETHASONE SODIUM PHOSPHATE 10 MG/ML IJ SOLN
INTRAMUSCULAR | Status: AC
  Filled 2019-12-02: qty 1

## 2019-12-02 MED ORDER — FENTANYL CITRATE (PF) 100 MCG/2ML IJ SOLN
INTRAMUSCULAR | Status: AC
  Filled 2019-12-02: qty 2

## 2019-12-02 MED ORDER — CELECOXIB 200 MG PO CAPS
200.0000 mg | ORAL_CAPSULE | ORAL | Status: AC
  Administered 2019-12-02: 200 mg via ORAL

## 2019-12-02 MED ORDER — KETOROLAC TROMETHAMINE 30 MG/ML IJ SOLN
INTRAMUSCULAR | Status: AC
  Filled 2019-12-02: qty 1

## 2019-12-02 MED ORDER — ACETAMINOPHEN 500 MG PO TABS
ORAL_TABLET | ORAL | Status: AC
  Filled 2019-12-02: qty 2

## 2019-12-02 MED ORDER — ACETAMINOPHEN 325 MG PO TABS: 325.0000 mg | ORAL_TABLET | Freq: Once | ORAL | Status: DC | PRN

## 2019-12-02 MED ORDER — MEPERIDINE HCL 25 MG/ML IJ SOLN: 6.2500 mg | INTRAMUSCULAR | Status: DC | PRN

## 2019-12-02 MED ORDER — OXYCODONE HCL 5 MG/5ML PO SOLN: 5.0000 mg | Freq: Once | ORAL | Status: DC | PRN

## 2019-12-02 MED ORDER — ROCURONIUM BROMIDE 10 MG/ML (PF) SYRINGE
PREFILLED_SYRINGE | INTRAVENOUS | Status: AC
  Filled 2019-12-02: qty 10

## 2019-12-02 MED ORDER — LIDOCAINE HCL (CARDIAC) PF 100 MG/5ML IV SOSY
PREFILLED_SYRINGE | INTRAVENOUS | Status: DC | PRN
  Administered 2019-12-02: 60 mg via INTRAVENOUS

## 2019-12-02 MED ORDER — CEFAZOLIN SODIUM-DEXTROSE 2-4 GM/100ML-% IV SOLN
2.0000 g | INTRAVENOUS | Status: AC
  Administered 2019-12-02: 2 g via INTRAVENOUS

## 2019-12-02 MED ORDER — KETOROLAC TROMETHAMINE 30 MG/ML IJ SOLN
INTRAMUSCULAR | Status: DC | PRN
  Administered 2019-12-02: 30 mg via INTRAVENOUS

## 2019-12-02 MED ORDER — PROPOFOL 10 MG/ML IV BOLUS
INTRAVENOUS | Status: DC | PRN
  Administered 2019-12-02: 160 mg via INTRAVENOUS
  Administered 2019-12-02: 50 mg via INTRAVENOUS
  Administered 2019-12-02: 40 mg via INTRAVENOUS

## 2019-12-02 MED ORDER — CEFAZOLIN SODIUM-DEXTROSE 2-4 GM/100ML-% IV SOLN
INTRAVENOUS | Status: AC
  Filled 2019-12-02: qty 100

## 2019-12-02 MED ORDER — HYDROMORPHONE HCL 1 MG/ML IJ SOLN: 0.2500 mg | INTRAMUSCULAR | Status: DC | PRN

## 2019-12-02 MED ORDER — CELECOXIB 200 MG PO CAPS
ORAL_CAPSULE | ORAL | Status: AC
  Filled 2019-12-02: qty 1

## 2019-12-02 MED ORDER — ACETAMINOPHEN 160 MG/5ML PO SOLN: 325.0000 mg | Freq: Once | ORAL | Status: DC | PRN

## 2019-12-02 MED ORDER — ONDANSETRON HCL 4 MG/2ML IJ SOLN
INTRAMUSCULAR | Status: AC
  Filled 2019-12-02: qty 2

## 2019-12-02 MED ORDER — ACETAMINOPHEN 10 MG/ML IV SOLN: 1000.0000 mg | Freq: Once | INTRAVENOUS | Status: DC | PRN

## 2019-12-02 MED ORDER — BUPIVACAINE HCL (PF) 0.25 % IJ SOLN
INTRAMUSCULAR | Status: DC | PRN
  Administered 2019-12-02: 30 mL

## 2019-12-02 MED ORDER — LACTATED RINGERS IV SOLN: INTRAVENOUS | Status: DC

## 2019-12-02 MED ORDER — ACETAMINOPHEN 500 MG PO TABS
1000.0000 mg | ORAL_TABLET | ORAL | Status: AC
  Administered 2019-12-02: 1000 mg via ORAL

## 2019-12-02 MED ORDER — 0.9 % SODIUM CHLORIDE (POUR BTL) OPTIME
TOPICAL | Status: DC | PRN
  Administered 2019-12-02: 500 mL

## 2019-12-02 MED ORDER — FENTANYL CITRATE (PF) 100 MCG/2ML IJ SOLN
INTRAMUSCULAR | Status: DC | PRN
  Administered 2019-12-02 (×2): 50 ug via INTRAVENOUS
  Administered 2019-12-02: 25 ug via INTRAVENOUS
  Administered 2019-12-02 (×3): 50 ug via INTRAVENOUS
  Administered 2019-12-02: 25 ug via INTRAVENOUS
  Administered 2019-12-02: 50 ug via INTRAVENOUS

## 2019-12-02 MED ORDER — MIDAZOLAM HCL 5 MG/5ML IJ SOLN
INTRAMUSCULAR | Status: DC | PRN
  Administered 2019-12-02: 2 mg via INTRAVENOUS

## 2019-12-02 MED ORDER — FENTANYL CITRATE (PF) 100 MCG/2ML IJ SOLN: 50.0000 ug | INTRAMUSCULAR | Status: DC | PRN

## 2019-12-02 MED ORDER — SUGAMMADEX SODIUM 200 MG/2ML IV SOLN
INTRAVENOUS | Status: DC | PRN
Start: 2019-12-02 — End: 2019-12-02
  Administered 2019-12-02: 50 mg via INTRAVENOUS
  Administered 2019-12-02: 200 mg via INTRAVENOUS

## 2019-12-02 MED ORDER — MIDAZOLAM HCL 2 MG/2ML IJ SOLN
INTRAMUSCULAR | Status: AC
  Filled 2019-12-02: qty 2

## 2019-12-02 MED ORDER — PROPOFOL 10 MG/ML IV BOLUS
INTRAVENOUS | Status: AC
  Filled 2019-12-02: qty 20

## 2019-12-02 MED ORDER — EPHEDRINE 5 MG/ML INJ
INTRAVENOUS | Status: AC
  Filled 2019-12-02: qty 10

## 2019-12-02 MED ORDER — MIDAZOLAM HCL 2 MG/2ML IJ SOLN: 1.0000 mg | INTRAMUSCULAR | Status: DC | PRN

## 2019-12-02 MED ORDER — GABAPENTIN 300 MG PO CAPS
ORAL_CAPSULE | ORAL | Status: AC
  Filled 2019-12-02: qty 1

## 2019-12-02 MED ORDER — OXYCODONE HCL 5 MG PO TABS: 5.0000 mg | ORAL_TABLET | Freq: Once | ORAL | Status: DC | PRN

## 2019-12-02 MED ORDER — ONDANSETRON HCL 4 MG/2ML IJ SOLN
INTRAMUSCULAR | Status: DC | PRN
  Administered 2019-12-02: 4 mg via INTRAVENOUS

## 2019-12-02 MED ORDER — GABAPENTIN 300 MG PO CAPS
300.0000 mg | ORAL_CAPSULE | ORAL | Status: AC
  Administered 2019-12-02: 300 mg via ORAL

## 2019-12-02 MED ORDER — DEXAMETHASONE SODIUM PHOSPHATE 4 MG/ML IJ SOLN
INTRAMUSCULAR | Status: DC | PRN
  Administered 2019-12-02: 10 mg via INTRAVENOUS

## 2019-12-02 MED ORDER — PROMETHAZINE HCL 25 MG/ML IJ SOLN
6.2500 mg | INTRAMUSCULAR | Status: DC | PRN
  Administered 2019-12-02: 6.25 mg via INTRAVENOUS

## 2019-12-02 MED ORDER — ROCURONIUM BROMIDE 100 MG/10ML IV SOLN
INTRAVENOUS | Status: DC | PRN
  Administered 2019-12-02 (×3): 20 mg via INTRAVENOUS
  Administered 2019-12-02: 60 mg via INTRAVENOUS
  Administered 2019-12-02: 20 mg via INTRAVENOUS

## 2019-12-02 MED ORDER — PROMETHAZINE HCL 25 MG/ML IJ SOLN
INTRAMUSCULAR | Status: AC
  Filled 2019-12-02: qty 1

## 2019-12-02 MED ORDER — EPHEDRINE SULFATE 50 MG/ML IJ SOLN
INTRAMUSCULAR | Status: DC | PRN
  Administered 2019-12-02 (×4): 20 mg via INTRAVENOUS

## 2019-12-02 MED ORDER — LIDOCAINE 2% (20 MG/ML) 5 ML SYRINGE
INTRAMUSCULAR | Status: AC
  Filled 2019-12-02: qty 5

## 2019-12-02 SURGICAL SUPPLY — 69 items
ADH SKN CLS APL DERMABOND .7 (GAUZE/BANDAGES/DRESSINGS) ×2
APL PRP STRL LF DISP 70% ISPRP (MISCELLANEOUS) ×2
BAG DECANTER FOR FLEXI CONT (MISCELLANEOUS) ×1 IMPLANT
BINDER BREAST LRG (GAUZE/BANDAGES/DRESSINGS) IMPLANT
BINDER BREAST MEDIUM (GAUZE/BANDAGES/DRESSINGS) IMPLANT
BINDER BREAST XLRG (GAUZE/BANDAGES/DRESSINGS) IMPLANT
BINDER BREAST XXLRG (GAUZE/BANDAGES/DRESSINGS) IMPLANT
BLADE SURG 10 STRL SS (BLADE) ×5 IMPLANT
BLADE SURG 15 STRL LF DISP TIS (BLADE) IMPLANT
BLADE SURG 15 STRL SS (BLADE) ×2
BNDG GAUZE ELAST 4 BULKY (GAUZE/BANDAGES/DRESSINGS) ×4 IMPLANT
CANISTER SUCT 1200ML W/VALVE (MISCELLANEOUS) ×2 IMPLANT
CHLORAPREP W/TINT 26 (MISCELLANEOUS) ×3 IMPLANT
CLIP VESOCCLUDE SM WIDE 6/CT (CLIP) ×1 IMPLANT
COVER MAYO STAND STRL (DRAPES) ×2 IMPLANT
COVER WAND RF STERILE (DRAPES) IMPLANT
DECANTER SPIKE VIAL GLASS SM (MISCELLANEOUS) IMPLANT
DERMABOND ADVANCED (GAUZE/BANDAGES/DRESSINGS) ×2
DERMABOND ADVANCED .7 DNX12 (GAUZE/BANDAGES/DRESSINGS) ×1 IMPLANT
DRAIN CHANNEL 15F RND FF W/TCR (WOUND CARE) ×2 IMPLANT
DRAIN CHANNEL 19F RND (DRAIN) IMPLANT
DRAPE TOP ARMCOVERS (MISCELLANEOUS) ×2 IMPLANT
DRAPE U-SHAPE 76X120 STRL (DRAPES) ×2 IMPLANT
DRAPE UTILITY XL STRL (DRAPES) ×2 IMPLANT
DRSG PAD ABDOMINAL 8X10 ST (GAUZE/BANDAGES/DRESSINGS) ×4 IMPLANT
DRSG TEGADERM 2-3/8X2-3/4 SM (GAUZE/BANDAGES/DRESSINGS) IMPLANT
ELECT BLADE 4.0 EZ CLEAN MEGAD (MISCELLANEOUS)
ELECT COATED BLADE 2.86 ST (ELECTRODE) ×2 IMPLANT
ELECT REM PT RETURN 9FT ADLT (ELECTROSURGICAL) ×2
ELECTRODE BLDE 4.0 EZ CLN MEGD (MISCELLANEOUS) IMPLANT
ELECTRODE REM PT RTRN 9FT ADLT (ELECTROSURGICAL) ×1 IMPLANT
EVACUATOR SILICONE 100CC (DRAIN) ×2 IMPLANT
GAUZE SPONGE 4X4 12PLY STRL (GAUZE/BANDAGES/DRESSINGS) IMPLANT
GAUZE SPONGE 4X4 12PLY STRL LF (GAUZE/BANDAGES/DRESSINGS) IMPLANT
GLOVE BIO SURGEON STRL SZ 6 (GLOVE) ×3 IMPLANT
GLOVE BIOGEL PI IND STRL 6.5 (GLOVE) IMPLANT
GLOVE BIOGEL PI IND STRL 7.0 (GLOVE) IMPLANT
GLOVE BIOGEL PI INDICATOR 6.5 (GLOVE) ×1
GLOVE BIOGEL PI INDICATOR 7.0 (GLOVE) ×2
GLOVE ECLIPSE 6.5 STRL STRAW (GLOVE) ×2 IMPLANT
GOWN STRL REUS W/ TWL LRG LVL3 (GOWN DISPOSABLE) ×2 IMPLANT
GOWN STRL REUS W/TWL LRG LVL3 (GOWN DISPOSABLE) ×6
MARKER SKIN DUAL TIP RULER LAB (MISCELLANEOUS) ×1 IMPLANT
NDL HYPO 25X1 1.5 SAFETY (NEEDLE) IMPLANT
NEEDLE HYPO 25X1 1.5 SAFETY (NEEDLE) ×2 IMPLANT
NS IRRIG 1000ML POUR BTL (IV SOLUTION) ×2 IMPLANT
PENCIL SMOKE EVACUATOR (MISCELLANEOUS) ×2 IMPLANT
PIN SAFETY STERILE (MISCELLANEOUS) IMPLANT
SET BASIN DAY SURGERY F.S. (CUSTOM PROCEDURE TRAY) ×2 IMPLANT
SHEET MEDIUM DRAPE 40X70 STRL (DRAPES) ×2 IMPLANT
SLEEVE SCD COMPRESS KNEE MED (MISCELLANEOUS) ×2 IMPLANT
SPONGE LAP 18X18 RF (DISPOSABLE) ×4 IMPLANT
STAPLER VISISTAT 35W (STAPLE) ×2 IMPLANT
STRIP CLOSURE SKIN 1/2X4 (GAUZE/BANDAGES/DRESSINGS) IMPLANT
SUT ETHILON 2 0 FS 18 (SUTURE) ×1 IMPLANT
SUT MNCRL AB 4-0 PS2 18 (SUTURE) ×4 IMPLANT
SUT PDS AB 2-0 CT2 27 (SUTURE) IMPLANT
SUT VIC AB 3-0 PS1 18 (SUTURE) ×10
SUT VIC AB 3-0 PS1 18XBRD (SUTURE) ×1 IMPLANT
SUT VIC AB 3-0 SH 27 (SUTURE)
SUT VIC AB 3-0 SH 27X BRD (SUTURE) IMPLANT
SUT VICRYL 4-0 PS2 18IN ABS (SUTURE) ×4 IMPLANT
SYR BULB IRRIGATION 50ML (SYRINGE) ×2 IMPLANT
SYR CONTROL 10ML LL (SYRINGE) ×1 IMPLANT
TAPE MEASURE VINYL STERILE (MISCELLANEOUS) ×1 IMPLANT
TOWEL GREEN STERILE FF (TOWEL DISPOSABLE) ×4 IMPLANT
TUBE CONNECTING 20X1/4 (TUBING) ×2 IMPLANT
UNDERPAD 30X36 HEAVY ABSORB (UNDERPADS AND DIAPERS) ×4 IMPLANT
YANKAUER SUCT BULB TIP NO VENT (SUCTIONS) ×2 IMPLANT

## 2019-12-02 NOTE — Anesthesia Postprocedure Evaluation (Signed)
Anesthesia Post Note  Patient: Brandy Thornton  Procedure(s) Performed: MASTOPEXY (Bilateral Breast)     Patient location during evaluation: PACU Anesthesia Type: General Level of consciousness: awake and alert Pain management: pain level controlled Vital Signs Assessment: post-procedure vital signs reviewed and stable Respiratory status: spontaneous breathing, nonlabored ventilation and respiratory function stable Cardiovascular status: blood pressure returned to baseline and stable Postop Assessment: no apparent nausea or vomiting Anesthetic complications: no    Last Vitals:  Vitals:   12/02/19 1509 12/02/19 1530  BP: 126/72 140/71  Pulse: 88 90  Resp: 14 16  Temp:  36.8 C  SpO2: 100% 100%    Last Pain:  Vitals:   12/02/19 1530  TempSrc:   PainSc: 0-No pain                 Lynda Rainwater

## 2019-12-02 NOTE — Op Note (Signed)
Operative Note   DATE OF OPERATION: 4.23.21  LOCATION: [facility]-outpatient  SURGICAL DIVISION: Plastic Surgery  PREOPERATIVE DIAGNOSES:  1. History breast cancer 2. History therapeutic radiation 3. Post operative asymmetry breasts  POSTOPERATIVE DIAGNOSES:  same  PROCEDURE:  Bilateral mastopexy  SURGEON: Irene Limbo MD MBA  ASSISTANT: none  ANESTHESIA:  General.   EBL: 50 ml  COMPLICATIONS: None immediate.   INDICATIONS FOR PROCEDURE:  The patient, Brandy Thornton, is a 64 y.o. female born on August 24, 1955, is here for treatment asymmetry breasts following right lumpectomy and adjuvant radiation.   FINDINGS: Right mastopexy 14 g Left mastopexy 188 g. Small seroma cavity noted right breast in area of prior lower inner quadrant lumpectomy and seroma capsule excised.  DESCRIPTION OF PROCEDURE:  The patient was marked standing in the preoperative area to mark sternal notch, chest midline, anterior axillary lines, inframammary folds. The location of new nipple areolar complex was marked at level of on inframammary fold on anterior surface left breast by palpation. This was marked symmetric over bilateral breasts. With aid of Wise pattern marker, location of new nipple areolar complex and vertical limbs (6cm) were markedby displacement of breasts along meridian.The patient was taken to the operating room. SCDs were placedand IV antibiotics were given. The patient's operative site was prepped and draped in a sterile fashion. A time out was performed and all information was confirmed to be correct.   Over right breast, areola incised at its border. Superior pedicle marked and de epithelialized. Pedicle developed to chest wall. Medial and lateral pillars elevated. Over lower inner quadrant encountered lumpectomy cavity seroma. Seroma cavity excised. Tethered scar noted in this area. Transverse scoring of undersurface pedicle complete to allow for elevation. Patient tailor tacked  closed.  Over left breast, superomedial pedicle marked and nipple areolar complexmarked with symmetric diameter to right breaset. Pedicle de epithelialized and developedto chest wall. Breast tissue resected over lower pole. Medial and lateral flaps developed.Additionalsuperior pole andlateral chest wall tissue excised.Breast tailor tacked closed.  Patient brought to upright sitting position and assessed for symmetry. Patent returned to supine position. Breast cavities irrigated and hemostasis obtained. Local anesthetic infiltrated throughout each breast. 15 Fr JP placed in each breast and secured with 2-0 nylon. Closure completed bilateralwith 3-0 vicryl to approximate dermis along inframammary fold and vertical limb. NAC inset with 4-0 vicryl in dermis. Skin closure completed with 4-0 monocryl subcuticular throughout. Tissue adhesive applied. Dry dressing and breast binder applied.  The patient was allowed to wake from anesthesia, extubated and taken to the recovery room in satisfactory condition.   SPECIMENS: right mastopexy and lumpectomy cavity, left mastopexy  DRAINS: 15 Fr JP in right and left breast

## 2019-12-02 NOTE — Transfer of Care (Signed)
Immediate Anesthesia Transfer of Care Note  Patient: Brandy Thornton  Procedure(s) Performed: MASTOPEXY (Bilateral Breast)  Patient Location: PACU  Anesthesia Type:General  Level of Consciousness: awake, alert , oriented and drowsy  Airway & Oxygen Therapy: Patient Spontanous Breathing and Patient connected to face mask oxygen  Post-op Assessment: Report given to RN and Post -op Vital signs reviewed and stable  Post vital signs: Reviewed and stable  Last Vitals:  Vitals Value Taken Time  BP    Temp    Pulse    Resp    SpO2      Last Pain:  Vitals:   12/02/19 1001  TempSrc: Temporal  PainSc: 0-No pain         Complications: No apparent anesthesia complications

## 2019-12-02 NOTE — Discharge Instructions (Signed)
No Tylenol until 4:15pm if needed. No Ibuprofen until 7:45pm if needed.    About my Jackson-Pratt Bulb Drain  What is a Jackson-Pratt bulb? A Jackson-Pratt is a soft, round device used to collect drainage. It is connected to a long, thin drainage catheter, which is held in place by one or two small stiches near your surgical incision site. When the bulb is squeezed, it forms a vacuum, forcing the drainage to empty into the bulb.  Emptying the Jackson-Pratt bulb- To empty the bulb: 1. Release the plug on the top of the bulb. 2. Pour the bulb's contents into a measuring container which your nurse will provide. 3. Record the time emptied and amount of drainage. Empty the drain(s) as often as your     doctor or nurse recommends.  Date                  Time                    Amount (Drain 1)                 Amount (Drain 2)  _____________________________________________________________________  _____________________________________________________________________  _____________________________________________________________________  _____________________________________________________________________  _____________________________________________________________________  _____________________________________________________________________  _____________________________________________________________________  _____________________________________________________________________  Squeezing the Jackson-Pratt Bulb- To squeeze the bulb: 1. Make sure the plug at the top of the bulb is open. 2. Squeeze the bulb tightly in your fist. You will hear air squeezing from the bulb. 3. Replace the plug while the bulb is squeezed. 4. Use a safety pin to attach the bulb to your clothing. This will keep the catheter from     pulling at the bulb insertion site.  When to call your doctor- Call your doctor if:  Drain site becomes red, swollen or hot.  You have a fever greater than 101  degrees F.  There is oozing at the drain site.  Drain falls out (apply a guaze bandage over the drain hole and secure it with tape).  Drainage increases daily not related to activity patterns. (You will usually have more drainage when you are active than when you are resting.)  Drainage has a bad odor.     Post Anesthesia Home Care Instructions  Activity: Get plenty of rest for the remainder of the day. A responsible individual must stay with you for 24 hours following the procedure.  For the next 24 hours, DO NOT: -Drive a car -Paediatric nurse -Drink alcoholic beverages -Take any medication unless instructed by your physician -Make any legal decisions or sign important papers.  Meals: Start with liquid foods such as gelatin or soup. Progress to regular foods as tolerated. Avoid greasy, spicy, heavy foods. If nausea and/or vomiting occur, drink only clear liquids until the nausea and/or vomiting subsides. Call your physician if vomiting continues.  Special Instructions/Symptoms: Your throat may feel dry or sore from the anesthesia or the breathing tube placed in your throat during surgery. If this causes discomfort, gargle with warm salt water. The discomfort should disappear within 24 hours.  If you had a scopolamine patch placed behind your ear for the management of post- operative nausea and/or vomiting:  1. The medication in the patch is effective for 72 hours, after which it should be removed.  Wrap patch in a tissue and discard in the trash. Wash hands thoroughly with soap and water. 2. You may remove the patch earlier than 72 hours if you experience unpleasant side effects which may include dry mouth, dizziness or visual  disturbances. 3. Avoid touching the patch. Wash your hands with soap and water after contact with the patch.

## 2019-12-02 NOTE — Anesthesia Preprocedure Evaluation (Addendum)
Anesthesia Evaluation  Patient identified by MRN, date of birth, ID band Patient awake    Reviewed: Allergy & Precautions, NPO status , Patient's Chart, lab work & pertinent test results  Airway Mallampati: I  TM Distance: >3 FB Neck ROM: Full    Dental  (+) Teeth Intact, Chipped,    Pulmonary neg pulmonary ROS,    breath sounds clear to auscultation       Cardiovascular hypertension,  Rhythm:Regular Rate:Normal     Neuro/Psych  Neuromuscular disease negative psych ROS   GI/Hepatic negative GI ROS, Neg liver ROS,   Endo/Other  negative endocrine ROS  Renal/GU negative Renal ROS     Musculoskeletal  (+) Arthritis ,   Abdominal Normal abdominal exam  (+)   Peds  Hematology negative hematology ROS (+)   Anesthesia Other Findings   Reproductive/Obstetrics                            Anesthesia Physical Anesthesia Plan  ASA: II  Anesthesia Plan: General   Post-op Pain Management:    Induction: Intravenous  PONV Risk Score and Plan: 4 or greater and Ondansetron, Dexamethasone, Midazolam and Scopolamine patch - Pre-op  Airway Management Planned: Oral ETT  Additional Equipment: None  Intra-op Plan:   Post-operative Plan: Extubation in OR  Informed Consent: I have reviewed the patients History and Physical, chart, labs and discussed the procedure including the risks, benefits and alternatives for the proposed anesthesia with the patient or authorized representative who has indicated his/her understanding and acceptance.     Dental advisory given  Plan Discussed with: CRNA  Anesthesia Plan Comments:        Anesthesia Quick Evaluation

## 2019-12-02 NOTE — Anesthesia Procedure Notes (Signed)
Procedure Name: Intubation Date/Time: 12/02/2019 11:26 AM Performed by: Justice Rocher, CRNA Pre-anesthesia Checklist: Patient identified, Emergency Drugs available, Suction available, Patient being monitored and Timeout performed Patient Re-evaluated:Patient Re-evaluated prior to induction Oxygen Delivery Method: Circle system utilized Preoxygenation: Pre-oxygenation with 100% oxygen Induction Type: IV induction Ventilation: Mask ventilation without difficulty Laryngoscope Size: Mac and 3 Grade View: Grade II Tube type: Oral Tube size: 7.5 mm Number of attempts: 1 Airway Equipment and Method: Stylet and Oral airway Placement Confirmation: ETT inserted through vocal cords under direct vision,  positive ETCO2,  breath sounds checked- equal and bilateral and CO2 detector Secured at: 21 cm Tube secured with: Tape Dental Injury: Teeth and Oropharynx as per pre-operative assessment

## 2019-12-02 NOTE — Interval H&P Note (Signed)
History and Physical Interval Note:  12/02/2019 10:58 AM  Brandy Thornton  has presented today for surgery, with the diagnosis of History breast cancer therapeutic radiation post-operative asymmetry breasts.  The various methods of treatment have been discussed with the patient and family. After consideration of risks, benefits and other options for treatment, the patient has consented to  Procedure(s): MASTOPEXY (Bilateral) as a surgical intervention.  The patient's history has been reviewed, patient examined, no change in status, stable for surgery.  I have reviewed the patient's chart and labs.  Questions were answered to the patient's satisfaction.     Arnoldo Hooker [name]

## 2019-12-05 ENCOUNTER — Encounter: Payer: Self-pay | Admitting: *Deleted

## 2019-12-05 LAB — SURGICAL PATHOLOGY

## 2019-12-06 NOTE — Addendum Note (Signed)
Addendum  created 12/06/19 1138 by Melonee Gerstel, Ernesta Amble, CRNA   Charge Capture section accepted

## 2020-01-13 ENCOUNTER — Telehealth: Payer: Managed Care, Other (non HMO) | Admitting: Hematology and Oncology

## 2020-01-15 NOTE — Progress Notes (Signed)
HEMATOLOGY-ONCOLOGY MYCHART VIDEO VISIT PROGRESS NOTE  I connected with Brandy Thornton on 01/16/2020 at  1:45 PM EDT by MyChart video conference and verified that I am speaking with the correct person using two identifiers.  I discussed the limitations, risks, security and privacy concerns of performing an evaluation and management service by MyChart and the availability of in person appointments.  I also discussed with the patient that there may be a patient responsible charge related to this service. The patient expressed understanding and agreed to proceed.  Patient's Location: Home Physician Location: Clinic  CHIEF COMPLIANT: Follow-upofright breast cancer tamoxifen therapy  INTERVAL HISTORY: Brandy Thornton is a 64 y.o. female with above-mentioned history of right breast cancer treated witha right lumpectomy, radiation,and whois currently on tamoxifen after she could not tolerate anastrozole therapy. She presents over MyChart todayto discuss tamoxifen toxicities.   Oncology History  Malignant neoplasm of lower-inner quadrant of right breast of female, estrogen receptor positive ([place])  10/20/2017 Initial Diagnosis   Palpable right breast mass LIQ: At 430: 2.8 cm, at 330 position: 1.1 cm, right axillary lymph node: Biopsy of all 3 revealed grade 2 invasive lobular cancer ER 80%, PR 70%, Ki-67 40%, HER-2 negative ratio 1.74, T2N1 stage II a AJCC 8   10/30/2017 Miscellaneous   Mammaprint: Low risk: 10-year risk of recurrence untreated 10% luminal type A   11/03/2017 - 05/11/2018 Anti-estrogen oral therapy   Neoadjuvant antiestrogen therapy with letrozole 2.5 mg daily   05/18/2018 Surgery   Right lumpectomy: ILC grade 2, 1.7 cm, LCIS, margins negative, 2/16 lymph nodes positive, ER 80%, PR 70%, HER-2 negative, Ki-67 40%, RCB class II, ypT1c, ypN1a    06/02/2018 Cancer Staging   Staging form: Breast, AJCC 8th Edition - Pathologic: No Stage Recommended (ypT1c, pN1a, cM0, G2, ER+, PR+,  HER2-) - Signed by Gardenia Phlegm, NP on 06/02/2018   06/21/2018 - 08/06/2018 Radiation Therapy   1. Right breast; 1.8 Gy in 28 fractions for a total of 50.4 Gy                      2. Right axilla; 1.8 Gy in 25 fractions for a total of 45 Gy                      3. Boost; 2 Gy in 5 fractions for a total of 10 Gy   08/06/2018 -  Anti-estrogen oral therapy   Anastrozole daily switched to tamoxifen 41m daily on 10/10/19    Observations/Objective:  There were no vitals filed for this visit. There is no height or weight on file to calculate BMI.  I have reviewed the data as listed CMP Latest Ref Rng & Units 11/29/2019 05/13/2018 04/26/2018  Glucose 70 - 99 mg/dL 118(H) 93 -  BUN 8 - 23 mg/dL 15 14 -  Creatinine 0.44 - 1.00 mg/dL 0.92 0.66 0.70  Sodium 135 - 145 mmol/L 142 141 -  Potassium 3.5 - 5.1 mmol/L 4.5 3.9 -  Chloride 98 - 111 mmol/L 107 107 -  CO2 22 - 32 mmol/L 25 26 -  Calcium 8.9 - 10.3 mg/dL 9.2 9.9 -  Total Protein 6.4 - 8.3 g/dL - - -  Total Bilirubin 0.2 - 1.2 mg/dL - - -  Alkaline Phos 40 - 150 U/L - - -  AST 5 - 34 U/L - - -  ALT 0 - 55 U/L - - -    Lab Results  Component  Value Date   WBC 6.5 10/28/2017   HGB 12.2 10/28/2017   HCT 36.4 10/28/2017   MCV 79.9 10/28/2017   PLT 448 (H) 10/28/2017   NEUTROABS 3.5 10/28/2017      Assessment Plan:  Malignant neoplasm of lower-inner quadrant of right breast of female, estrogen receptor positive ([place]) 10/20/2017:Palpable right breast mass LIQ: At 430: 2.8 cm, at 330 position: 1.1 cm, right axillary lymph node: Biopsy of all 3 revealed grade 2 invasive lobular cancer ER 80%, PR 70%, Ki-67 40%, HER-2 negative ratio 1.74, T2N1 stage II a AJCC 8  Mammaprint: Low risk Treatment summary: Neoadjuvant antiestrogen therapy with letrozole 2.5 mg daily Right lumpectomy: ILC grade 2, 1.7 cm, LCIS, margins negative, 2/16 lymph nodes positive, ER 80%, PR 70%, HER-2 negative, Ki-67 40%, RCB class II, ypT1c,  ypN1a Adjuvant radiation therapy started 06/21/2018 completed 08/05/2018 ---------------------------------------------------------------------------------------------------------------------------------------------------- Treatment plan: Adjuvant antiestrogen therapy with letrozole 2.5 mg dailystarted 12/27/2019switched to anastrozole 07/12/2019 switched to Tamoxifen April 2021.  Tamoxifen toxicities:  So far she is tolerating the 10 mg dosage of tamoxifen extremely well.  Breast cancer surveillance: 1.01/28/2019: Mammogram: Benign breast density category C 2.Breast exam 01/16/2020: Benign 3.Bone density 03/16/2019: T score -1.1: Mild osteopenia: Recommended calcium and vitamin D and exercises Bilateral mastopexy 12/02/19  Return to clinic in 1 year for follow-up  I discussed the assessment and treatment plan with the patient. The patient was provided an opportunity to ask questions and all were answered. The patient agreed with the plan and demonstrated an understanding of the instructions. The patient was advised to call back or seek an in-person evaluation if the symptoms worsen or if the condition fails to improve as anticipated.   I provided 20 minutes of face-to-face MyChart video visit time during this encounter.    Rulon Eisenmenger, MD 01/16/2020   I, Molly Dorshimer, am acting as scribe for Nicholas Lose, MD.  I have reviewed the above documentation for accuracy and completeness, and I agree with the above.

## 2020-01-16 ENCOUNTER — Inpatient Hospital Stay: Payer: Managed Care, Other (non HMO) | Attending: Hematology and Oncology | Admitting: Hematology and Oncology

## 2020-01-16 DIAGNOSIS — Z170 Estrogen receptor positive status [ER+]: Secondary | ICD-10-CM

## 2020-01-16 DIAGNOSIS — C50311 Malignant neoplasm of lower-inner quadrant of right female breast: Secondary | ICD-10-CM | POA: Diagnosis not present

## 2020-01-16 DIAGNOSIS — Z923 Personal history of irradiation: Secondary | ICD-10-CM | POA: Insufficient documentation

## 2020-01-16 DIAGNOSIS — Z79810 Long term (current) use of selective estrogen receptor modulators (SERMs): Secondary | ICD-10-CM | POA: Insufficient documentation

## 2020-01-16 NOTE — Assessment & Plan Note (Signed)
10/20/2017:Palpable right breast mass LIQ: At 430: 2.8 cm, at 330 position: 1.1 cm, right axillary lymph node: Biopsy of all 3 revealed grade 2 invasive lobular cancer ER 80%, PR 70%, Ki-67 40%, HER-2 negative ratio 1.74, T2N1 stage II a AJCC 8  Mammaprint: Low risk Treatment summary: Neoadjuvant antiestrogen therapy with letrozole 2.5 mg daily Right lumpectomy: ILC grade 2, 1.7 cm, LCIS, margins negative, 2/16 lymph nodes positive, ER 80%, PR 70%, HER-2 negative, Ki-67 40%, RCB class II, ypT1c, ypN1a Adjuvant radiation therapy started 06/21/2018 completed 08/05/2018 ---------------------------------------------------------------------------------------------------------------------------------------------------- Treatment plan: Adjuvant antiestrogen therapy with letrozole 2.5 mg dailystarted 12/27/2019switched to anastrozole 07/12/2019 Anastrozole toxicities: She could not tolerate anastrozole.  She discontinued it. I recommended tamoxifen therapy.  She will start 10 mg daily and will increase to 20 mg daily.  I discussed the pros and cons of tamoxifen.   Breast cancer surveillance: 1.01/28/2019: Mammogram: Benign breast density category C 2.Breast exam 01/16/2020: Benign 3.Bone density 03/16/2019: T score -1.1: Mild osteopenia: Recommended calcium and vitamin D and exercises Bilateral mastopexy 12/02/19 36-monthfollow-up for MyChart virtual video visit to assess tolerability to tamoxifen.

## 2020-03-09 ENCOUNTER — Other Ambulatory Visit: Payer: Self-pay | Admitting: Hematology and Oncology

## 2020-03-09 ENCOUNTER — Other Ambulatory Visit: Payer: Self-pay | Admitting: Adult Health

## 2020-03-09 DIAGNOSIS — Z9889 Other specified postprocedural states: Secondary | ICD-10-CM

## 2020-03-22 ENCOUNTER — Ambulatory Visit
Admission: RE | Admit: 2020-03-22 | Discharge: 2020-03-22 | Disposition: A | Discharge status: Home / Self Care | Payer: Managed Care, Other (non HMO) | Source: Ambulatory Visit | Attending: Hematology and Oncology | Admitting: Hematology and Oncology

## 2020-03-22 ENCOUNTER — Other Ambulatory Visit: Payer: Self-pay

## 2020-03-22 DIAGNOSIS — Z9889 Other specified postprocedural states: Secondary | ICD-10-CM

## 2020-08-27 IMAGING — MG DIGITAL DIAGNOSTIC BILAT W/ TOMO W/ CAD
6 of 9 series · 6 of 25 positions shown · non-contrast
Comparison: Previous exam(s).

CLINICAL DATA: Status post right lumpectomy and radiation therapy
for breast cancer in 3796. She had a bilateral breast lift
12/02/2019.

EXAM:
DIGITAL DIAGNOSTIC BILATERAL MAMMOGRAM WITH TOMO AND CAD

[R MLO]
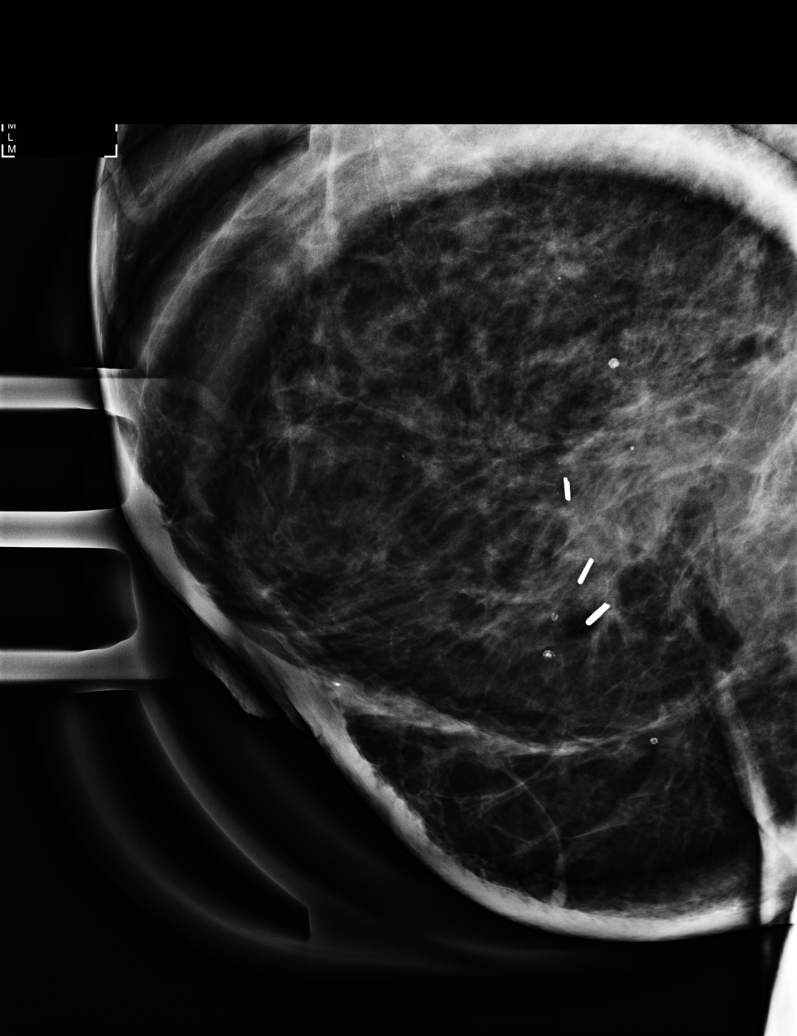

[L CC synth-2D]
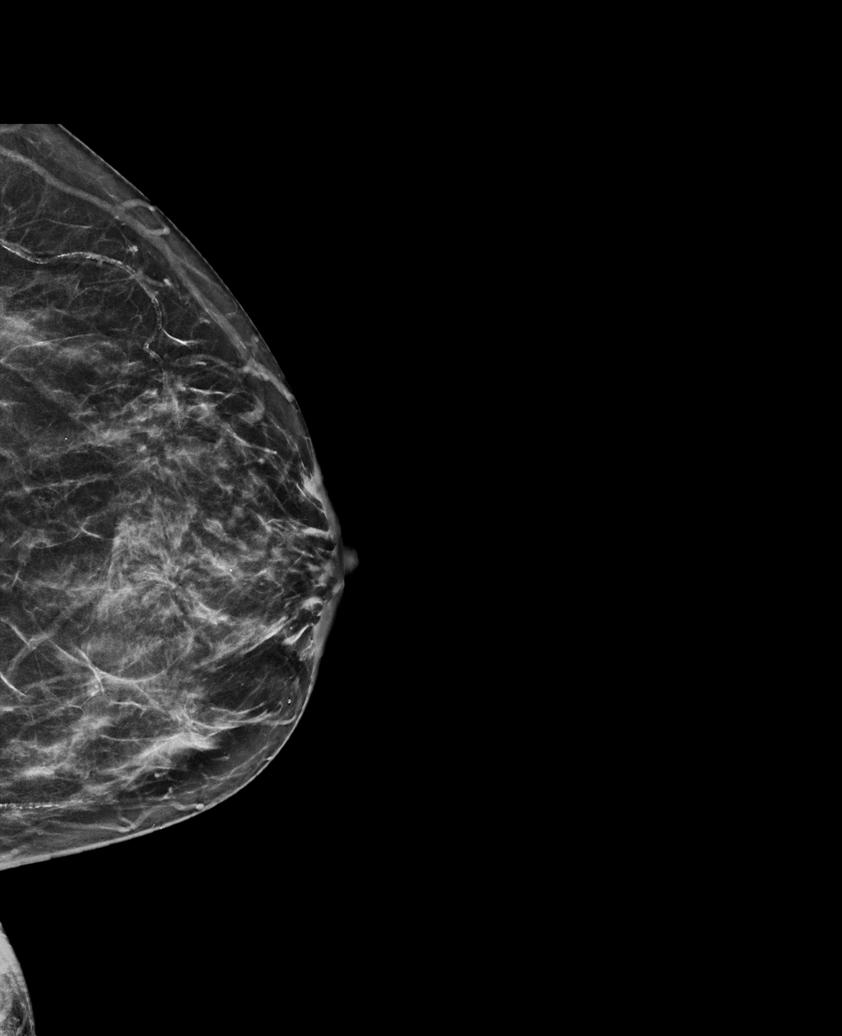

[R CC synth-2D]
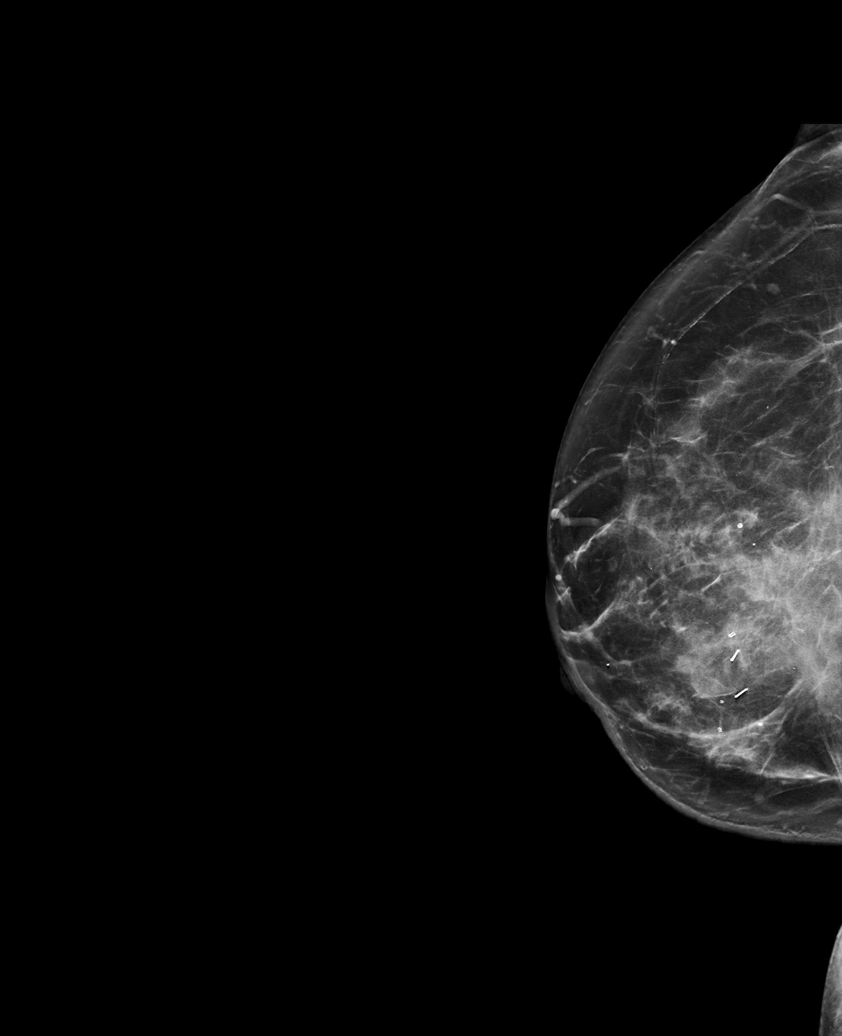

[R MLO synth-2D]
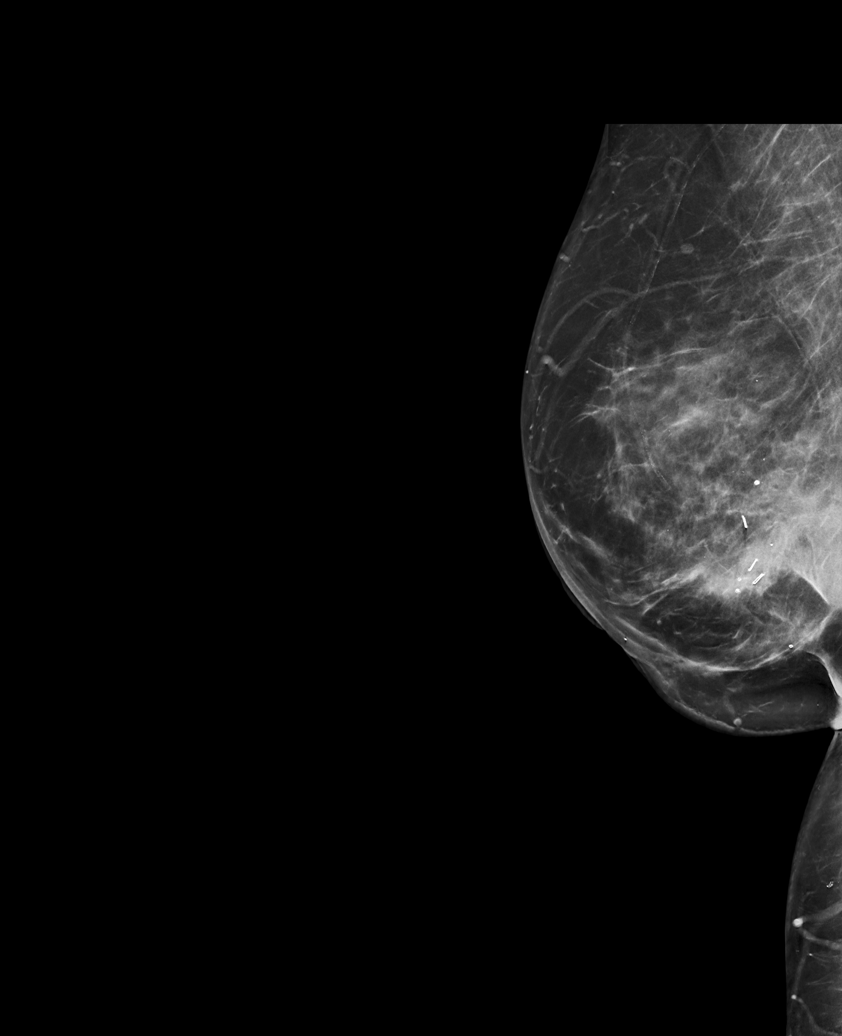

[L MLO synth-2D]
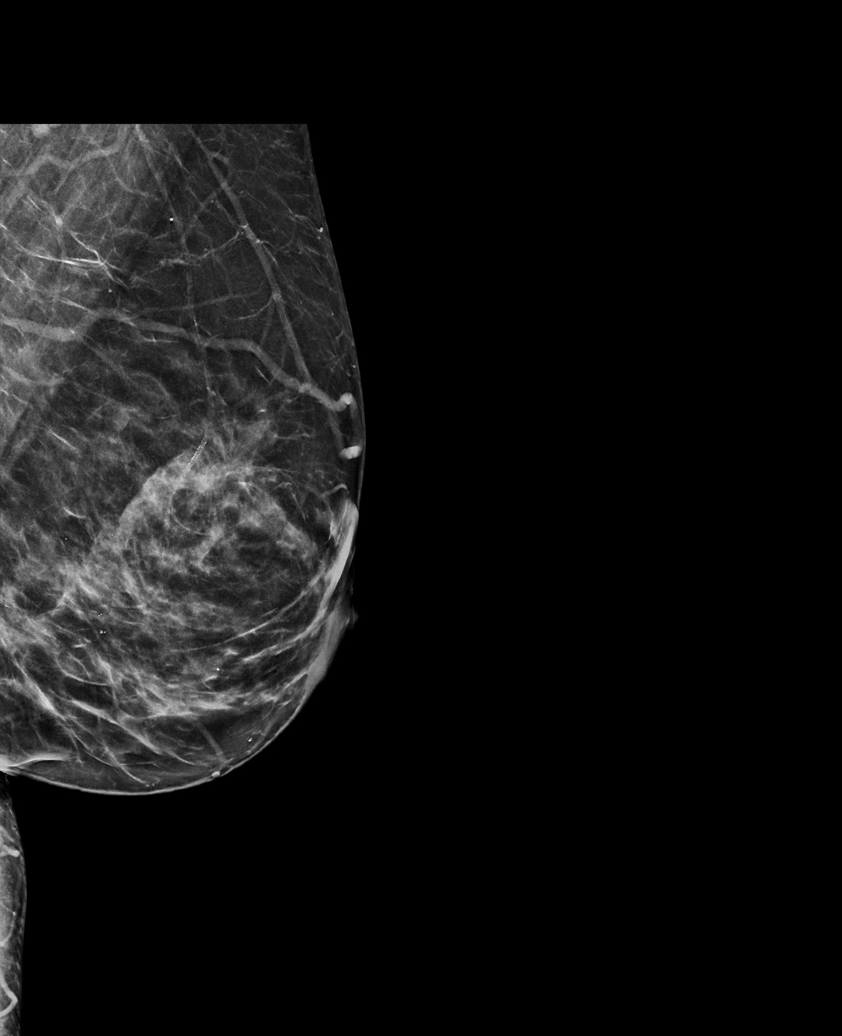

[R MLO tomo · tomo slice 41/80.0]
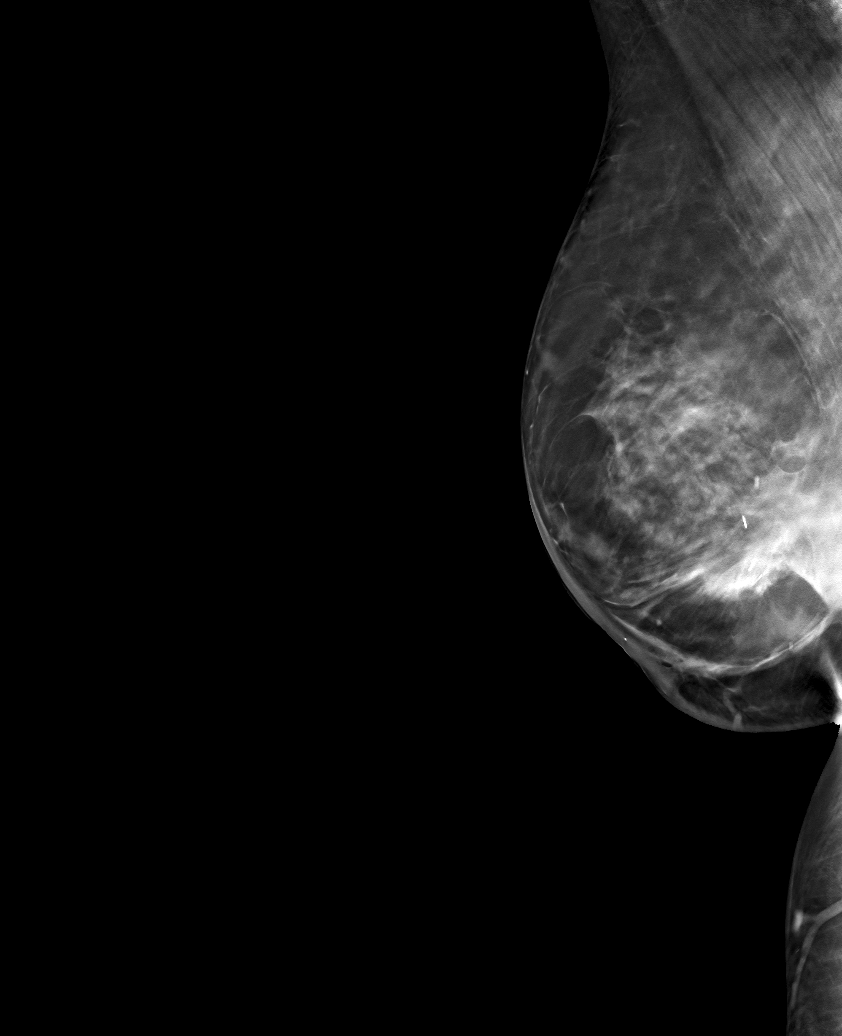

[6 of 25 positions shown; findings below may reference images not displayed]

ACR Breast Density Category b: There are scattered areas of
fibroglandular density.
FINDINGS: Interval multiple bilateral areas of architectural distortion
compatible scarring associated with the patient's recent surgery.
Stable post lumpectomy changes on the right.

Mammographic images were processed with CAD.
IMPRESSION: No evidence of malignancy.

RECOMMENDATION:
Bilateral diagnostic mammogram in 1 year.

I have discussed the findings and recommendations with the patient.
If applicable, a reminder letter will be sent to the patient
regarding the next appointment.

BI-RADS CATEGORY  2: Benign.

## 2021-03-21 ENCOUNTER — Other Ambulatory Visit: Payer: Self-pay | Admitting: Hematology and Oncology

## 2021-03-21 DIAGNOSIS — Z9889 Other specified postprocedural states: Secondary | ICD-10-CM

## 2021-04-11 ENCOUNTER — Ambulatory Visit
Admission: RE | Admit: 2021-04-11 | Discharge: 2021-04-11 | Disposition: A | Discharge status: Home / Self Care | Payer: 59 | Source: Ambulatory Visit | Attending: Hematology and Oncology | Admitting: Hematology and Oncology

## 2021-04-11 ENCOUNTER — Other Ambulatory Visit: Payer: Self-pay

## 2021-04-11 DIAGNOSIS — Z9889 Other specified postprocedural states: Secondary | ICD-10-CM

## 2022-03-14 ENCOUNTER — Ambulatory Visit (INDEPENDENT_AMBULATORY_CARE_PROVIDER_SITE_OTHER): Payer: PRIVATE HEALTH INSURANCE | Admitting: Specialist

## 2022-03-14 ENCOUNTER — Encounter: Payer: Self-pay | Admitting: Specialist

## 2022-03-14 ENCOUNTER — Ambulatory Visit: Payer: Self-pay

## 2022-03-14 VITALS — BP 157/91 | HR 90 | Ht 66.0 in | Wt 180.0 lb

## 2022-03-14 DIAGNOSIS — M25561 Pain in right knee: Secondary | ICD-10-CM

## 2022-03-14 DIAGNOSIS — M222X1 Patellofemoral disorders, right knee: Secondary | ICD-10-CM

## 2022-03-14 MED ORDER — IBUPROFEN 600 MG PO TABS: 600.0000 mg | ORAL_TABLET | Freq: Four times a day (QID) | ORAL | 2 refills | Status: AC | PRN

## 2022-03-14 NOTE — Progress Notes (Signed)
Office Visit Note   Patient: Brandy Thornton           Date of Birth: [birth year]           MRN: [id number] Visit Date: 03/14/2022              Requested by: Carol Ada, [place],  [place] [address] PCP: Carol Ada, MD   Assessment & Plan: Visit Diagnoses:  1. Acute pain of right knee   2. Patellofemoral pain syndrome of right knee     Plan: The main ways of treat osteoarthritis, that are found to be success. Weight loss helps to decrease pain. Exercise is important to maintaining cartilage and thickness and strengthening. NSAIDs like motrin, tylenol, alleve are meds decreasing the inflamation. Ice is okay  In afternoon and evening and hot shower in the am Please call a month ahead of follow up appt so we can order new Synvisc One material.   Follow-Up Instructions: Return in about 6 weeks (around 04/25/2022).   Orders:  Orders Placed This Encounter  Procedures   XR KNEE 3 VIEW RIGHT   No orders of the defined types were placed in this encounter.     Procedures: No procedures performed   Clinical Data: No additional findings.   Subjective: Chief Complaint  Patient presents with   Right Knee - Pain    66 year old female with history of right knee symtoms and popping and catching. No swelling or pain. She has popping with initiating standing and walking. No night pain. Unable to kneel on the knees right as much as left. History of previous left knee patella fracture repair.  Getting up at night has to go one step at a time with the left leg ahead of the right. Has left foot plantar fascitis. First in the AM the heel pain is severe.     Review of Systems  Constitutional: Negative.   HENT:  Positive for facial swelling and hearing loss.   Eyes: Negative.   Respiratory:  Positive for cough.   Cardiovascular: Negative.   Gastrointestinal: Negative.   Endocrine: Negative.   Genitourinary: Negative.   Musculoskeletal:  Negative.   Skin: Negative.   Allergic/Immunologic: Negative.   Neurological: Negative.   Hematological: Negative.   Psychiatric/Behavioral: Negative.       Objective: Vital Signs: BP (!) 157/91 (BP Location: Left Arm, Patient Position: Sitting, Cuff Size: Normal)   Pulse 90   Ht '5\' 6"'$  (1.676 m)   Wt 180 lb (81.6 kg)   BMI 29.05 kg/m   Physical Exam Constitutional:      Appearance: She is well-developed.  HENT:     Head: Normocephalic and atraumatic.  Eyes:     Pupils: Pupils are equal, round, and reactive to light.  Pulmonary:     Effort: Pulmonary effort is normal.     Breath sounds: Normal breath sounds.  Abdominal:     General: Bowel sounds are normal.     Palpations: Abdomen is soft.  Musculoskeletal:        General: Normal range of motion.     Cervical back: Normal range of motion and neck supple.  Skin:    General: Skin is warm and dry.  Neurological:     Mental Status: She is alert and oriented to person, place, and time.  Psychiatric:        Behavior: Behavior normal.        Thought Content: Thought content  normal.        Judgment: Judgment normal.     Ortho Exam  Specialty Comments:  No specialty comments available.  Imaging: No results found.   PMFS History: Patient Active Problem List   Diagnosis Date Noted   Genetic testing 11/05/2017   Family history of breast cancer    Malignant neoplasm of lower-inner quadrant of right breast of female, estrogen receptor positive ([place]) 10/22/2017   Chondromalacia of left knee 06/02/2016   Past Medical History:  Diagnosis Date   Benign hypertension    Bilateral carpal tunnel syndrome    Family history of breast cancer    Fatigue    Genetic testing 11/05/2017   Breast/GYN panel (23 genes) @ [facility] - No pathogenic mutations detected   Heart murmur    Hot flashes    Personal history of radiation therapy     Family History  Problem Relation Age of Onset   Breast cancer Sister 28       currently  46   Prostate cancer Father 44       currently 85   Stomach cancer Maternal Grandfather        dx 72s; deceased 46s    Past Surgical History:  Procedure Laterality Date   AUGMENTATION MAMMAPLASTY Bilateral    bilateral lift 12/02/19   BREAST EXCISIONAL BIOPSY Bilateral    BREAST LUMPECTOMY Right 05/18/2018   BREAST LUMPECTOMY WITH RADIOACTIVE SEED AND AXILLARY LYMPH NODE DISSECTION Right 05/18/2018   Procedure: RIGHT BREAST LUMPECTOMY WITH RADIOACTIVE SEED X2 AND RIGHT AXILLARY LYMPH NODE DISSECTION;  Surgeon: [name] Overall, MD;  Location: [facility];  Service: General;  Laterality: Right;   FOOT SURGERY     KNEE SURGERY     MASTOPEXY Bilateral 12/02/2019   Procedure: MASTOPEXY;  Surgeon: Irene Limbo, MD;  Location: [facility];  Service: Plastics;  Laterality: Bilateral;   PARTIAL HYSTERECTOMY     Social History   Occupational History   Not on file  Tobacco Use   Smoking status: Never   Smokeless tobacco: Never  Substance and Sexual Activity   Alcohol use: Yes    Comment: social   Drug use: No   Sexual activity: Not on file

## 2022-03-14 NOTE — Patient Instructions (Signed)
The main ways of treat osteoarthritis, that are found to be success. Weight loss helps to decrease pain. Exercise is important to maintaining cartilage and thickness and strengthening. NSAIDs like motrin, tylenol, alleve are meds decreasing the inflamation. Ice is okay  In afternoon and evening and hot shower in the am Please call a month ahead of follow up appt so we can order new Synvisc One material.

## 2022-04-25 ENCOUNTER — Ambulatory Visit: Payer: Self-pay | Admitting: Specialist

## 2022-04-28 ENCOUNTER — Ambulatory Visit (INDEPENDENT_AMBULATORY_CARE_PROVIDER_SITE_OTHER): Payer: No Typology Code available for payment source

## 2022-04-28 ENCOUNTER — Encounter: Payer: Self-pay | Admitting: Podiatry

## 2022-04-28 ENCOUNTER — Ambulatory Visit (INDEPENDENT_AMBULATORY_CARE_PROVIDER_SITE_OTHER): Payer: No Typology Code available for payment source | Admitting: Podiatry

## 2022-04-28 DIAGNOSIS — M722 Plantar fascial fibromatosis: Secondary | ICD-10-CM

## 2022-04-28 MED ORDER — METHYLPREDNISOLONE 4 MG PO TBPK: ORAL_TABLET | ORAL | 0 refills | Status: DC

## 2022-04-28 MED ORDER — TRIAMCINOLONE ACETONIDE 40 MG/ML IJ SUSP
20.0000 mg | Freq: Once | INTRAMUSCULAR | Status: AC
  Administered 2022-04-28: 20 mg

## 2022-04-28 MED ORDER — MELOXICAM 15 MG PO TABS: 15.0000 mg | ORAL_TABLET | Freq: Every day | ORAL | 3 refills | Status: DC

## 2022-04-28 NOTE — Progress Notes (Signed)
Subjective:  Patient ID: Brandy Thornton, female    DOB: 01-18-1956,  MRN: [id number] HPI Chief Complaint  Patient presents with   Foot Pain    Plantar heel left - aching x 6 months, AM pain, trying to exercise more, Podiatrist Jefm Bryant) gave 2 shots, helped arch pain, tried OTC inserts, also Podiatrist made brace she's been wearing as well   New Patient (Initial Visit)    66 y.o. female presents with the above complaint.   ROS: Denies fever chills nausea vomiting muscle aches pains calf pain back pain chest pain shortness of breath.  Past Medical History:  Diagnosis Date   Benign hypertension    Bilateral carpal tunnel syndrome    Family history of breast cancer    Fatigue    Genetic testing 11/05/2017   Breast/GYN panel (23 genes) @ [facility] - No pathogenic mutations detected   Heart murmur    Hot flashes    Personal history of radiation therapy    Past Surgical History:  Procedure Laterality Date   AUGMENTATION MAMMAPLASTY Bilateral    bilateral lift 12/02/19   BREAST EXCISIONAL BIOPSY Bilateral    BREAST LUMPECTOMY Right 05/18/2018   BREAST LUMPECTOMY WITH RADIOACTIVE SEED AND AXILLARY LYMPH NODE DISSECTION Right 05/18/2018   Procedure: RIGHT BREAST LUMPECTOMY WITH RADIOACTIVE SEED X2 AND RIGHT AXILLARY LYMPH NODE DISSECTION;  Surgeon: [name] Overall, MD;  Location: [facility];  Service: General;  Laterality: Right;   FOOT SURGERY     KNEE SURGERY     MASTOPEXY Bilateral 12/02/2019   Procedure: MASTOPEXY;  Surgeon: Irene Limbo, MD;  Location: [facility];  Service: Plastics;  Laterality: Bilateral;   PARTIAL HYSTERECTOMY      Current Outpatient Medications:    meloxicam (MOBIC) 15 MG tablet, Take 1 tablet (15 mg total) by mouth daily., Disp: 30 tablet, Rfl: 3   methylPREDNISolone (MEDROL DOSEPAK) 4 MG TBPK tablet, 6 day dose pack - take as directed, Disp: 21 tablet, Rfl: 0   Adapalene-Benzoyl Peroxide 0.1-2.5 % gel, Apply 1  Application topically daily., Disp: , Rfl:    ibuprofen (ADVIL) 600 MG tablet, Take 1 tablet (600 mg total) by mouth every 6 (six) hours as needed for mild pain., Disp: 90 tablet, Rfl: 2   Nutritional Supplements (LIVER DEFENSE PO), Take by mouth., Disp: , Rfl:    tamoxifen (NOLVADEX) 20 MG tablet, Take 1 tablet (20 mg total) by mouth daily., Disp: 90 tablet, Rfl: 3   valsartan-hydrochlorothiazide (DIOVAN-HCT) 320-12.5 MG per tablet, Take 1 tablet by mouth daily., Disp: , Rfl:   No Known Allergies Review of Systems Objective:  There were no vitals filed for this visit.  General: Well developed, nourished, in no acute distress, alert and oriented x3   Dermatological: Skin is warm, dry and supple bilateral. Nails x 10 are well maintained; remaining integument appears unremarkable at this time. There are no open sores, no preulcerative lesions, no rash or signs of infection present.  Vascular: Dorsalis Pedis artery and Posterior Tibial artery pedal pulses are 2/4 bilateral with immedate capillary fill time. Pedal hair growth present. No varicosities and no lower extremity edema present bilateral.   Neruologic: Grossly intact via light touch bilateral. Vibratory intact via tuning fork bilateral. Protective threshold with Semmes Wienstein monofilament intact to all pedal sites bilateral. Patellar and Achilles deep tendon reflexes 2+ bilateral. No Babinski or clonus noted bilateral.   Musculoskeletal: No gross boney pedal deformities bilateral. No pain, crepitus, or limitation noted with foot and  ankle range of motion bilateral. Muscular strength 5/5 in all groups tested bilateral.  Pain on palpation medial calcaneal tubercle of the left heel.  Gait: Unassisted, Nonantalgic.    Radiographs:  Radiographs taken today demonstrate osseously mature individual evaluation of the plantar heel does demonstrate a very early osteophyte but a chronic inflammation of the plantar fascia at his calcaneal  insertion site is noted.  Assessment & Plan:   Assessment: Chronic intractable Planter fasciitis left foot.  Plan: Discussed etiology pathology conservative versus surgical therapies.  Started her on methylprednisolone to be followed by meloxicam provided an injection 20 mg Kenalog 5 mg Marcaine point of maximal tenderness.  I will place her in a plantar fascial brace.  Discussed appropriate shoe gear stretching exercises ice therapy and shoe gear modifications.  I like to follow-up with her in 1 month.  If not improved will consider MRI since she has seen Dr. Cleda Mccreedy in the past.     [name] T. [place], [place]

## 2022-05-13 ENCOUNTER — Encounter: Payer: Self-pay | Admitting: Podiatry

## 2022-05-13 ENCOUNTER — Other Ambulatory Visit: Payer: Self-pay | Admitting: Family Medicine

## 2022-05-13 DIAGNOSIS — Z1231 Encounter for screening mammogram for malignant neoplasm of breast: Secondary | ICD-10-CM

## 2022-05-27 ENCOUNTER — Ambulatory Visit (INDEPENDENT_AMBULATORY_CARE_PROVIDER_SITE_OTHER): Payer: 59 | Admitting: Podiatry

## 2022-05-27 ENCOUNTER — Encounter: Payer: Self-pay | Admitting: Podiatry

## 2022-05-27 DIAGNOSIS — S93692A Other sprain of left foot, initial encounter: Secondary | ICD-10-CM | POA: Diagnosis not present

## 2022-05-27 DIAGNOSIS — M722 Plantar fascial fibromatosis: Secondary | ICD-10-CM | POA: Diagnosis not present

## 2022-05-27 NOTE — Progress Notes (Signed)
She presents today for follow-up of her Planter fasciitis.  States that I thought I had allergic reaction from the prednisone such as Benadryl it end up being that I had poison ivy.  I was given another round of prednisone and there you know it really did not help my foot that much.  The injection did not help either.  Objective: Vital signs are stable she alert oriented x3 severe pain on palpation medial calcaneal tubercle of her left heel.  Assessment: Chronic intractable Planter fasciitis left foot.  Probable tear.  Plan: Requesting MRI for this left foot.  I feel that she needs to have this evaluated since all conservative therapies have failed amongst multiple different specialties and practices so this will be for differential diagnosis and surgical planning.  I expect a plantar fascial tear.

## 2022-06-02 ENCOUNTER — Ambulatory Visit
Admission: RE | Admit: 2022-06-02 | Discharge: 2022-06-02 | Disposition: A | Discharge status: Home / Self Care | Payer: 59 | Source: Ambulatory Visit | Attending: Podiatry | Admitting: Podiatry

## 2022-06-02 DIAGNOSIS — S93692A Other sprain of left foot, initial encounter: Secondary | ICD-10-CM

## 2022-06-05 ENCOUNTER — Ambulatory Visit
Admission: RE | Admit: 2022-06-05 | Discharge: 2022-06-05 | Disposition: A | Discharge status: Home / Self Care | Payer: 59 | Source: Ambulatory Visit | Attending: Family Medicine | Admitting: Family Medicine

## 2022-06-05 DIAGNOSIS — Z1231 Encounter for screening mammogram for malignant neoplasm of breast: Secondary | ICD-10-CM

## 2022-06-16 ENCOUNTER — Ambulatory Visit (INDEPENDENT_AMBULATORY_CARE_PROVIDER_SITE_OTHER): Payer: No Typology Code available for payment source | Admitting: Podiatry

## 2022-06-16 ENCOUNTER — Encounter: Payer: Self-pay | Admitting: Podiatry

## 2022-06-16 ENCOUNTER — Telehealth: Payer: Self-pay | Admitting: *Deleted

## 2022-06-16 DIAGNOSIS — M722 Plantar fascial fibromatosis: Secondary | ICD-10-CM | POA: Diagnosis not present

## 2022-06-16 NOTE — Progress Notes (Signed)
She presents today for follow-up of her MRI states that her left heel is still just killing her she would like to consider surgery instead of physical therapy.  Objective: Vital signs stable alert and oriented x3.  Pulses are palpable.  Neurologic sensorium is intact deep tendon reflexes are intact muscle strength is normal and symmetrical.  She has no pain on palpation of the peroneal tendons since there was a question as to whether or not there may be a torn peroneal tendon.  She still has exquisite pain on palpation of the medial calcaneal tubercle and on compression of the calcaneus.  MRI does demonstrate possible tear of the peroneus brevis tendon Planter fasciitis with no tear and edema to the calcaneus.  Assessment: Chronic intractable Planter fasciitis left.  Plan: Consented her today for an endoscopic plantar fasciotomy with PRP injection of her left heel.  She understands this and is amenable to it understands that she will be out of work for at least a month.  She was provided with consent form today and we did discuss the possible postop complications which may include but are not limited to postop pain bleeding swell infection recurrence need for further surgery of the left correction under correction loss of digit loss limb loss of life recurrence of this particular condition.  She was provided with a cam walker information regarding the surgery center and anesthesia group and I will follow-up with her Friday of this week

## 2022-06-16 NOTE — Telephone Encounter (Signed)
-----   Message from Garrel Ridgel, [place] sent at 06/12/2022  7:23 AM EDT ----- Plantar fasciitis no tear.  She could try PT or consider surgery.

## 2022-06-17 ENCOUNTER — Telehealth: Payer: Self-pay | Admitting: Podiatry

## 2022-06-17 NOTE — Telephone Encounter (Addendum)
DOS: 06/20/2022  [employer] Effective 08/11/2021  Endoscopic Plantar Fasciotomy Lt (10258) PRP Injection Lt (20550)  DX: M72.2  Deductible: $0 Out-of-Pocket: $3,500 with $2,991.08 remaining CoInsurance:  Prior Authorization is Required for CPT code [phone] but Not for CPT code 20550 per Jane C.  Call Reference #: [id number]  Fax clinicals to [phone] Pending Authorization #: [id number]  Authorization #: [id number] Authorization Valid: 06/20/2022 - 09/18/2022  Called and spoke with Windy Fast about authorization status still pending. Told her I was told that after I faxed clinicals with an URGENT/STAT on them we would have a decision today and Almyra Free stated it normally takes 10-15 business days to render a decision. She sent an escalated request to try and have the authorization determined by 12 noon today and if its not, patients sx will be rescheduled to next Friday, 06/27/2022.  Called [employer] back and spoke to [place]. I told her since I didn't have a response by 12 noon, I would need a response by 3 pm EST today. I told her that when I called on Tuesday, I was told I would have a determination by today for patients surgery that's scheduled for tomorrow. Gilmore Laroche told me the pending review has been escalated to the highest level. If no decision has been made by 3 pm we will have to reschedule patients surgery.

## 2022-06-18 ENCOUNTER — Other Ambulatory Visit: Payer: Self-pay | Admitting: Podiatry

## 2022-06-18 MED ORDER — OXYCODONE-ACETAMINOPHEN 10-325 MG PO TABS: 1.0000 | ORAL_TABLET | Freq: Three times a day (TID) | ORAL | 0 refills | Status: AC | PRN

## 2022-06-18 MED ORDER — CEPHALEXIN 500 MG PO CAPS: 500.0000 mg | ORAL_CAPSULE | Freq: Three times a day (TID) | ORAL | 0 refills | Status: DC

## 2022-06-18 MED ORDER — ONDANSETRON HCL 4 MG PO TABS: 4.0000 mg | ORAL_TABLET | Freq: Three times a day (TID) | ORAL | 0 refills | Status: DC | PRN

## 2022-06-19 ENCOUNTER — Telehealth: Payer: Self-pay | Admitting: Podiatry

## 2022-06-19 NOTE — Telephone Encounter (Signed)
Called to speak to [facility] as Caren Griffins is out the rest of the week but she was already gone for the day. Spoke to [place] and told her we moved patients surgery date to next Friday, 06/27/22 as we still have not gotten authorization from [facility] for her surgery.

## 2022-06-23 DIAGNOSIS — M722 Plantar fascial fibromatosis: Secondary | ICD-10-CM | POA: Diagnosis not present

## 2022-06-25 ENCOUNTER — Encounter: Payer: No Typology Code available for payment source | Admitting: Podiatry

## 2022-06-30 ENCOUNTER — Ambulatory Visit (INDEPENDENT_AMBULATORY_CARE_PROVIDER_SITE_OTHER): Payer: No Typology Code available for payment source | Admitting: Podiatry

## 2022-06-30 ENCOUNTER — Encounter: Payer: Self-pay | Admitting: Podiatry

## 2022-06-30 DIAGNOSIS — Z9889 Other specified postprocedural states: Secondary | ICD-10-CM

## 2022-06-30 DIAGNOSIS — M722 Plantar fascial fibromatosis: Secondary | ICD-10-CM

## 2022-06-30 NOTE — Progress Notes (Signed)
She presents today date of surgery was 06/20/2022 endoscopic plantar fasciotomy with a PRP injection.  She states that the foot is feeling really good continues to wear her cam boot at all times states that it feels better now than it has in months.  Had a hard time taking the oxycodone she states she only took 2 pills.  After that she states she had no pain whatsoever.  Objective: Vital signs are stable she is alert and oriented x3.  There is no erythema edema cellulitis drainage odor she has no palpable medial band of the plantar fascia.  She has sutures are intact margins are well coapting.  Assessment: Well-healing surgical foot left.  Plan: Read dressed with Band-Aids today stockinette and an Ace bandage.  Put her back in her cam walker she is to remain in the cam walker 24/7 until follow-up with her next week for suture removal.  Once sutures are out she will continue the use of the cam walker at nighttime for 1 month but she will be allowed to get back into her tennis shoes.

## 2022-07-02 ENCOUNTER — Encounter: Payer: No Typology Code available for payment source | Admitting: Podiatry

## 2022-07-09 ENCOUNTER — Encounter: Payer: Self-pay | Admitting: Podiatry

## 2022-07-09 ENCOUNTER — Encounter: Payer: No Typology Code available for payment source | Admitting: Podiatry

## 2022-07-09 ENCOUNTER — Ambulatory Visit (INDEPENDENT_AMBULATORY_CARE_PROVIDER_SITE_OTHER): Payer: No Typology Code available for payment source | Admitting: Podiatry

## 2022-07-09 DIAGNOSIS — Z9889 Other specified postprocedural states: Secondary | ICD-10-CM

## 2022-07-09 DIAGNOSIS — M722 Plantar fascial fibromatosis: Secondary | ICD-10-CM

## 2022-07-09 NOTE — Progress Notes (Signed)
She presents today postop visit #2 date of surgery 06/27/2022 endoscopic plantar fasciotomy PRP injection she states is feeling pretty good I really did not sleep in the boot last night like I am supposed to though.  Objective: Vital signs are stable alert oriented x 3.  Sutures removed today margins remain well coapted she has some tenderness on palpation of the medial band of the plantar fascia at its insertion site.  But no open lesions or wounds no signs of infection.  Assessment: Well-healing surgical foot x 2 weeks.  Plan: Follow-up with her in about 2 weeks unguinal let her get back into regular tennis shoes this Friday and then she will continue the use of the cam boot at nighttime for 1 month.

## 2022-07-10 ENCOUNTER — Encounter: Payer: Self-pay | Admitting: Podiatry

## 2022-07-11 ENCOUNTER — Telehealth: Payer: Self-pay | Admitting: *Deleted

## 2022-07-11 NOTE — Telephone Encounter (Signed)
Called patient to give recommendations/instructions for a pedicure per physician, verbalized understanding.

## 2022-07-16 ENCOUNTER — Encounter: Payer: No Typology Code available for payment source | Admitting: Podiatry

## 2022-07-23 ENCOUNTER — Ambulatory Visit (INDEPENDENT_AMBULATORY_CARE_PROVIDER_SITE_OTHER): Payer: No Typology Code available for payment source | Admitting: Podiatry

## 2022-07-23 ENCOUNTER — Encounter: Payer: No Typology Code available for payment source | Admitting: Podiatry

## 2022-07-23 ENCOUNTER — Encounter: Payer: Self-pay | Admitting: Podiatry

## 2022-07-23 DIAGNOSIS — Z9889 Other specified postprocedural states: Secondary | ICD-10-CM

## 2022-07-23 DIAGNOSIS — M722 Plantar fascial fibromatosis: Secondary | ICD-10-CM

## 2022-07-23 NOTE — Progress Notes (Signed)
She presents today date of surgery 06/23/2022.  States that she is doing just great no problems whatsoever.  She states that it does doing really good.  Objective: Vital signs are stable alert and oriented x 3.  Pulses are palpable.  There is no erythema edema salines drainage or odor to the left heel.  No reproducible pain left heel.  Assessment: Resolving pain from an endoscopic plantar fasciotomy left.  No plantar fascial symptoms at this time.  Plan: Continue physical therapy at home.  Continue use of her cam boot and I will follow-up with her the day after she goes back to work on 27 December.

## 2022-07-30 ENCOUNTER — Encounter: Payer: No Typology Code available for payment source | Admitting: Podiatry

## 2022-08-06 ENCOUNTER — Other Ambulatory Visit: Payer: No Typology Code available for payment source

## 2022-08-06 ENCOUNTER — Encounter: Payer: No Typology Code available for payment source | Admitting: Podiatry

## 2022-09-10 ENCOUNTER — Ambulatory Visit: Payer: No Typology Code available for payment source | Admitting: Podiatry

## 2022-09-16 ENCOUNTER — Encounter: Payer: Self-pay | Admitting: Podiatry

## 2022-09-16 ENCOUNTER — Ambulatory Visit (INDEPENDENT_AMBULATORY_CARE_PROVIDER_SITE_OTHER): Payer: No Typology Code available for payment source | Admitting: Podiatry

## 2022-09-16 DIAGNOSIS — M722 Plantar fascial fibromatosis: Secondary | ICD-10-CM

## 2022-09-16 DIAGNOSIS — Z9889 Other specified postprocedural states: Secondary | ICD-10-CM

## 2022-09-16 NOTE — Progress Notes (Signed)
  Subjective:  Patient ID: Brandy Brandy, female    DOB: January 31, 1956,  MRN: [id number]  Chief Complaint  Patient presents with   Routine Post Op    POV #4 DOS 06/27/2022 EPF LT, PRP INJECTION   "Its felt fine, but I did go walking for over a mile for a couple days and had some pain. Am I ready for that yet? Also, I wear still toes at work, can I go back to those kind of shoes?"    DOS: 06/27/22  Procedure: Left EPF and PRP injection  67 y.o. female returns for POV#4. Doing well and not having too much pain. Did do a mile long walk and had pain after and has question about her steel toe shoes at work   Review of Systems: Negative except as noted in the HPI. Denies N/V/F/Ch.  Past Medical History:  Diagnosis Date   Benign hypertension    Bilateral carpal tunnel syndrome    Family history of breast cancer    Fatigue    Genetic testing 11/05/2017   Breast/GYN panel (23 genes) @ [facility] - No pathogenic mutations detected   Heart murmur    Hot flashes    Personal history of radiation therapy     Current Outpatient Medications:    Adapalene-Benzoyl Peroxide 0.1-2.5 % gel, Apply 1 Application topically daily., Disp: , Rfl:    hydroquinone 4 % cream, Apply topically 2 (two) times daily., Disp: , Rfl:    ibuprofen (ADVIL) 600 MG tablet, Take 1 tablet (600 mg total) by mouth every 6 (six) hours as needed for mild pain., Disp: 90 tablet, Rfl: 2   Nutritional Supplements (LIVER DEFENSE PO), Take by mouth., Disp: , Rfl:    valsartan-hydrochlorothiazide (DIOVAN-HCT) 320-12.5 MG per tablet, Take 1 tablet by mouth daily., Disp: , Rfl:   Social History   Tobacco Use  Smoking Status Never  Smokeless Tobacco Never    No Known Allergies Objective:  There were no vitals filed for this visit. There is no height or weight on file to calculate BMI. Constitutional Well developed. Well nourished.  Vascular Foot warm and well perfused. Capillary refill normal to all digits.   Neurologic  Normal speech. Oriented to person, place, and time. Epicritic sensation to light touch grossly present bilaterally.  Dermatologic Skin healing well without signs of infection. Skin edges well coapted without signs of infection.  Orthopedic: Tenderness to palpation noted about the surgical site.    Assessment:  No diagnosis found. Plan:  Patient was evaluated and treated and all questions answered.  S/p foot surgery left -Progressing as expected post-operatively. -WB Status: WBAT in regular shoes -Doing well. Work forms filled out.  Follow-up with Dr Milinda Pointer in 6 weeks.   No follow-ups on file.

## 2022-10-28 ENCOUNTER — Ambulatory Visit (INDEPENDENT_AMBULATORY_CARE_PROVIDER_SITE_OTHER): Payer: No Typology Code available for payment source | Admitting: Podiatry

## 2022-10-28 ENCOUNTER — Ambulatory Visit (INDEPENDENT_AMBULATORY_CARE_PROVIDER_SITE_OTHER): Payer: No Typology Code available for payment source

## 2022-10-28 DIAGNOSIS — R52 Pain, unspecified: Secondary | ICD-10-CM

## 2022-10-28 NOTE — Progress Notes (Signed)
She presents today stating that she is having pain to the second digit of the left foot.  She states that the previous surgical procedure to the left foot is doing much better and she is able to get around pretty well.  She states that she still has some tenderness in the midfoot and if she does a lot of walking she gets tired but otherwise she is able to get back to work regularly.  This second toe is becoming more problematic with shoe gear and more painful all the time.  States that is affecting her ability perform her daily activities.  Objective: Vital signs are stable she is alert and oriented x 3.  Pulses are palpable.  She no longer has pain on palpation medial calcaneal tubercle of the left foot over the medial longitudinal arch.  Hammertoe deformity of the second left toe does demonstrate a cocked up hammertoe with PIPJ callus and postinflammatory hyperpigmentation which is exquisitely tender and palpable on palpation.  The toe will not straighten to 100% and obviously is arthritic at the PIPJ.  Radiographs confirm this with arthroplasties to the third and fourth toes.  Assessment: Hammertoe deformity second left.  Plan: Consented her today for arthroplasty PIPJ second digit left foot.  Understands possible postop complications which may include but not limited to postop pain bleeding swell infection recurrence need further surgery overcorrection under correction also digit loss limb loss of life we discussed anesthesia group as well as the surgical center she understands this is minimal she will follow-up with me in the near future.

## 2022-11-12 ENCOUNTER — Telehealth: Payer: Self-pay | Admitting: Urology

## 2022-11-12 NOTE — Telephone Encounter (Signed)
DOS - 11/28/22  HAMMERTOE REPAIR 2ND LEFT --- [place] [id number] HAS BEEN APPROVED, AUTH # [id number], GOOD FROM 11/28/22 - 02/26/23.

## 2022-11-26 ENCOUNTER — Other Ambulatory Visit: Payer: Self-pay | Admitting: Podiatry

## 2022-11-26 MED ORDER — CEPHALEXIN 500 MG PO CAPS: 500.0000 mg | ORAL_CAPSULE | Freq: Three times a day (TID) | ORAL | 0 refills | Status: DC

## 2022-11-26 MED ORDER — ONDANSETRON HCL 4 MG PO TABS: 4.0000 mg | ORAL_TABLET | Freq: Three times a day (TID) | ORAL | 0 refills | Status: DC | PRN

## 2022-11-26 MED ORDER — HYDROCODONE-ACETAMINOPHEN 10-325 MG PO TABS: 1.0000 | ORAL_TABLET | Freq: Four times a day (QID) | ORAL | 0 refills | Status: AC | PRN

## 2022-11-28 DIAGNOSIS — M2042 Other hammer toe(s) (acquired), left foot: Secondary | ICD-10-CM

## 2022-12-03 ENCOUNTER — Encounter: Payer: Self-pay | Admitting: Podiatry

## 2022-12-03 ENCOUNTER — Ambulatory Visit (INDEPENDENT_AMBULATORY_CARE_PROVIDER_SITE_OTHER): Payer: No Typology Code available for payment source | Admitting: Podiatry

## 2022-12-03 ENCOUNTER — Ambulatory Visit (INDEPENDENT_AMBULATORY_CARE_PROVIDER_SITE_OTHER): Payer: No Typology Code available for payment source

## 2022-12-03 DIAGNOSIS — Z9889 Other specified postprocedural states: Secondary | ICD-10-CM | POA: Diagnosis not present

## 2022-12-03 NOTE — Progress Notes (Signed)
   Chief Complaint  Patient presents with   Routine Post Op    POV # 1 DOS 11/28/22 --- ARTHROPLASTY PIPJ 2ND TOE LEFT FOOT    Subjective:  Patient presents today status post hammertoe arthroplasty second digit left foot with Dr. Al Corpus.  DOS: 11/28/2022.  Patient is doing very well.  Pain very tolerable.  WBAT in surgical shoe as instructed.  No new complaints at this time  Past Medical History:  Diagnosis Date   Benign hypertension    Bilateral carpal tunnel syndrome    Family history of breast cancer    Fatigue    Genetic testing 11/05/2017   Breast/GYN panel (23 genes) @ [facility] - No pathogenic mutations detected   Heart murmur    Hot flashes    Personal history of radiation therapy     Past Surgical History:  Procedure Laterality Date   AUGMENTATION MAMMAPLASTY Bilateral    bilateral lift 12/02/19   BREAST EXCISIONAL BIOPSY Bilateral    BREAST LUMPECTOMY Right 05/18/2018   BREAST LUMPECTOMY WITH RADIOACTIVE SEED AND AXILLARY LYMPH NODE DISSECTION Right 05/18/2018   Procedure: RIGHT BREAST LUMPECTOMY WITH RADIOACTIVE SEED X2 AND RIGHT AXILLARY LYMPH NODE DISSECTION;  Surgeon: Ovidio Kin, MD;  Location: [facility];  Service: General;  Laterality: Right;   FOOT SURGERY     KNEE SURGERY     MASTOPEXY Bilateral 12/02/2019   Procedure: MASTOPEXY;  Surgeon: Glenna Fellows, MD;  Location:  SURGERY CENTER;  Service: Plastics;  Laterality: Bilateral;   PARTIAL HYSTERECTOMY      No Known Allergies  Objective/Physical Exam Neurovascular status intact.  Incision well coapted with sutures intact. No sign of infectious process noted. No dehiscence.  No edema.  Radiographic Exam LT foot 12/03/2022:  Arthroplasty of the IPJ noted.  Clean osteotomy sites.  No osseous irregularities.  Otherwise well-healing surgical foot  Assessment: 1. s/p hammertoe arthroplasty second digit left. DOS: 11/28/2022   Plan of Care:  1. Patient was evaluated. X-rays  reviewed 2.  Continue WBAT surgical shoe 3.  Patient may begin washing and showering getting the foot wet.  Recommend triple antibiotic ointment over the incision sites and sutures 4.  Return to clinic 1 week suture removal with Dr. Al Corpus   Felecia Shelling, DPM [facility]  Dr. Felecia Shelling, DPM    [address] [place], [place] [address]                Office [phone]  Fax [phone]

## 2022-12-09 ENCOUNTER — Other Ambulatory Visit: Payer: Self-pay | Admitting: Podiatry

## 2022-12-09 DIAGNOSIS — Z9889 Other specified postprocedural states: Secondary | ICD-10-CM

## 2022-12-18 ENCOUNTER — Ambulatory Visit (INDEPENDENT_AMBULATORY_CARE_PROVIDER_SITE_OTHER): Payer: No Typology Code available for payment source | Admitting: Podiatry

## 2022-12-18 DIAGNOSIS — Z9889 Other specified postprocedural states: Secondary | ICD-10-CM

## 2022-12-18 NOTE — Progress Notes (Signed)
She presents today for postop visit date of surgery was 11/28/2018 for arthroplasty second digit of the left foot.  Patient denies any pain no fever chills nausea vomit muscle aches.  Objective: Vital signs stable alert oriented x 3 presents her Darco shoe and light dressing.  Once removed demonstrates no erythematous mild edema postinflammatory hyperpigmentation toes in good alignment.  Sutures are intact was removed and margins remain well coapted.  Assessment: Well-healing surgical toe x 3 weeks.  Plan: Sutures removed today demonstrated to her how to wrap the toe with [place] and I will follow-up with her in 2 weeks she will continue use of the Darco shoe.

## 2023-01-08 ENCOUNTER — Encounter: Payer: Self-pay | Admitting: Podiatry

## 2023-01-08 ENCOUNTER — Ambulatory Visit: Payer: No Typology Code available for payment source | Admitting: Podiatry

## 2023-01-08 DIAGNOSIS — M205X2 Other deformities of toe(s) (acquired), left foot: Secondary | ICD-10-CM

## 2023-01-08 DIAGNOSIS — Z9889 Other specified postprocedural states: Secondary | ICD-10-CM

## 2023-01-10 NOTE — Progress Notes (Signed)
She presents today for follow-up of her arthroplasty date of surgery 11/28/2018 for second digit of the left foot.  States that is just a little tender but I am ready to get back into a regular shoe.  Objective: Vital signs are stable alert oriented x 3.  Second digit of the left foot appears to be healing very normally she has been wrapping the toe but she been wrapping it in the middle of the toe rather than the proximal portion of the toe where the procedure was performed so she still has some swelling in the proximal area.  There is no pain on palpation or range of motion of the toe.  Is gone on to heal uneventfully with no open lesions or wounds.  Assessment: Well-healing surgical toe.  Plan: Demonstrated to her how to wrap the toe today and allow her back into regular shoe gear I will follow-up with her in 3 to 4 weeks just to make sure she is doing well questions or concerns she will notify us immediately.  She did state that she was going to have to start taking on another assignment and this comes been the more time on her feet.

## 2023-02-10 ENCOUNTER — Encounter: Payer: Self-pay | Admitting: Podiatry

## 2023-02-10 ENCOUNTER — Ambulatory Visit (INDEPENDENT_AMBULATORY_CARE_PROVIDER_SITE_OTHER): Payer: No Typology Code available for payment source | Admitting: Podiatry

## 2023-02-10 DIAGNOSIS — M205X2 Other deformities of toe(s) (acquired), left foot: Secondary | ICD-10-CM

## 2023-02-10 DIAGNOSIS — Z9889 Other specified postprocedural states: Secondary | ICD-10-CM

## 2023-02-10 NOTE — Progress Notes (Signed)
She presents today postop visit #4 date of surgery 11/28/2018 for arthroplasty PIPJ second.  States that the toe is fine and she has dark.  Objective: Vital signs stable alert oriented x 3 there is no erythema edema cellulitis drainage or odor appears to be healing very nicely.  There is some postinflammatory hyperpigmentation that is still present.  Assessment: Well-healing surgical toe.  Plan: I recommended that she continue to wrap the toe on a regular basis just to give it some starting this as it continues to heal over the next few weeks and I will follow-up with her on an as-needed basis.

## 2023-06-17 ENCOUNTER — Other Ambulatory Visit: Payer: Self-pay | Admitting: Family Medicine

## 2023-06-17 DIAGNOSIS — Z1231 Encounter for screening mammogram for malignant neoplasm of breast: Secondary | ICD-10-CM

## 2023-07-06 ENCOUNTER — Ambulatory Visit
Admission: RE | Admit: 2023-07-06 | Discharge: 2023-07-06 | Disposition: A | Discharge status: Home / Self Care | Payer: 59 | Source: Ambulatory Visit | Attending: Family Medicine | Admitting: Family Medicine

## 2023-07-06 DIAGNOSIS — Z1231 Encounter for screening mammogram for malignant neoplasm of breast: Secondary | ICD-10-CM

## 2023-10-22 DIAGNOSIS — E785 Hyperlipidemia, unspecified: Secondary | ICD-10-CM | POA: Diagnosis not present

## 2023-10-22 DIAGNOSIS — E669 Obesity, unspecified: Secondary | ICD-10-CM | POA: Diagnosis not present

## 2023-10-22 DIAGNOSIS — Z23 Encounter for immunization: Secondary | ICD-10-CM | POA: Diagnosis not present

## 2023-10-22 DIAGNOSIS — I10 Essential (primary) hypertension: Secondary | ICD-10-CM | POA: Diagnosis not present

## 2023-10-22 DIAGNOSIS — Z0000 Encounter for general adult medical examination without abnormal findings: Secondary | ICD-10-CM | POA: Diagnosis not present

## 2023-10-23 ENCOUNTER — Other Ambulatory Visit: Payer: Self-pay | Admitting: Family Medicine

## 2023-10-23 DIAGNOSIS — E2839 Other primary ovarian failure: Secondary | ICD-10-CM

## 2024-06-23 ENCOUNTER — Other Ambulatory Visit

## 2024-06-23 ENCOUNTER — Other Ambulatory Visit (HOSPITAL_BASED_OUTPATIENT_CLINIC_OR_DEPARTMENT_OTHER)

## 2024-08-19 ENCOUNTER — Other Ambulatory Visit: Payer: Self-pay | Admitting: Family Medicine

## 2024-08-19 DIAGNOSIS — Z1231 Encounter for screening mammogram for malignant neoplasm of breast: Secondary | ICD-10-CM

## 2024-08-22 ENCOUNTER — Inpatient Hospital Stay: Admission: RE | Admit: 2024-08-22 | Discharge: 2024-08-22 | Attending: Family Medicine | Admitting: Family Medicine

## 2024-08-22 DIAGNOSIS — Z1231 Encounter for screening mammogram for malignant neoplasm of breast: Secondary | ICD-10-CM
